# Patient Record
Sex: Male | Born: 1965
Health system: Southern US, Community
[De-identification: ages and names within clinical notes are randomized; demographics above are authoritative.]

## PROBLEM LIST (undated history)

## (undated) DIAGNOSIS — N529 Male erectile dysfunction, unspecified: Secondary | ICD-10-CM

## (undated) DIAGNOSIS — T7840XA Allergy, unspecified, initial encounter: Secondary | ICD-10-CM

## (undated) DIAGNOSIS — B20 Human immunodeficiency virus [HIV] disease: Secondary | ICD-10-CM

## (undated) DIAGNOSIS — Z21 Asymptomatic human immunodeficiency virus [HIV] infection status: Secondary | ICD-10-CM

## (undated) DIAGNOSIS — L853 Xerosis cutis: Secondary | ICD-10-CM

## (undated) DIAGNOSIS — G5600 Carpal tunnel syndrome, unspecified upper limb: Secondary | ICD-10-CM

## (undated) DIAGNOSIS — G4730 Sleep apnea, unspecified: Secondary | ICD-10-CM

## (undated) DIAGNOSIS — E291 Testicular hypofunction: Secondary | ICD-10-CM

## (undated) DIAGNOSIS — F32A Depression, unspecified: Secondary | ICD-10-CM

## (undated) HISTORY — DX: Allergy, unspecified, initial encounter: T78.40XA

## (undated) HISTORY — DX: Male erectile dysfunction, unspecified: N52.9

## (undated) HISTORY — DX: Xerosis cutis: L85.3

## (undated) HISTORY — DX: Carpal tunnel syndrome, unspecified upper limb: G56.00

## (undated) HISTORY — PX: WISDOM TOOTH EXTRACTION: SHX21

## (undated) HISTORY — DX: Testicular hypofunction: E29.1

## (undated) HISTORY — PX: HERNIA REPAIR: SHX51

---

## 2008-07-21 HISTORY — PX: FRACTURE SURGERY: SHX138

## 2009-12-06 ENCOUNTER — Ambulatory Visit: Payer: Self-pay | Admitting: Family Medicine

## 2009-12-06 DIAGNOSIS — J309 Allergic rhinitis, unspecified: Secondary | ICD-10-CM | POA: Insufficient documentation

## 2009-12-06 LAB — CONVERTED CEMR LAB
Bilirubin Urine: NEGATIVE
Nitrite: NEGATIVE
Protein, U semiquant: NEGATIVE
Urobilinogen, UA: 0.2

## 2009-12-10 ENCOUNTER — Telehealth: Payer: Self-pay | Admitting: Family Medicine

## 2009-12-10 DIAGNOSIS — L851 Acquired keratosis [keratoderma] palmaris et plantaris: Secondary | ICD-10-CM

## 2009-12-10 LAB — CONVERTED CEMR LAB
ALT: 16 units/L (ref 0–53)
AST: 16 units/L (ref 0–37)
Alkaline Phosphatase: 73 units/L (ref 39–117)
BUN: 11 mg/dL (ref 6–23)
Basophils Relative: 0.3 % (ref 0.0–3.0)
Bilirubin, Direct: 0.1 mg/dL (ref 0.0–0.3)
Chloride: 105 meq/L (ref 96–112)
Cholesterol: 165 mg/dL (ref 0–200)
Creatinine, Ser: 0.8 mg/dL (ref 0.4–1.5)
Eosinophils Relative: 2.7 % (ref 0.0–5.0)
GFR calc non Af Amer: 131 mL/min (ref >60)
LDL Cholesterol: 99 mg/dL (ref 0–99)
Lymphocytes Relative: 26.8 % (ref 12.0–46.0)
Monocytes Relative: 8.3 % (ref 3.0–12.0)
Neutrophils Relative %: 61.9 % (ref 43.0–77.0)
PSA: 1.31 ng/mL (ref 0.10–4.00)
Platelets: 241 10*3/uL (ref 150.0–400.0)
Potassium: 4.2 meq/L (ref 3.5–5.1)
RBC: 4.94 M/uL (ref 4.22–5.81)
Total Bilirubin: 1.1 mg/dL (ref 0.3–1.2)
Total CHOL/HDL Ratio: 3
Total Protein: 6.8 g/dL (ref 6.0–8.3)
Triglycerides: 85 mg/dL (ref 0.0–149.0)
VLDL: 17 mg/dL (ref 0.0–40.0)
WBC: 6 10*3/uL (ref 4.5–10.5)

## 2010-01-11 ENCOUNTER — Ambulatory Visit: Payer: Self-pay | Admitting: Family Medicine

## 2010-01-11 DIAGNOSIS — G5600 Carpal tunnel syndrome, unspecified upper limb: Secondary | ICD-10-CM

## 2010-02-19 ENCOUNTER — Telehealth: Payer: Self-pay | Admitting: Family Medicine

## 2010-08-20 NOTE — Assessment & Plan Note (Signed)
Summary: ONE MTH ROV // RS   Vital Signs:  Patient profile:   45 year old male Weight:      225 pounds Temp:     98.2 degrees F oral BP sitting:   110 / 80  (left arm) Cuff size:   regular  Vitals Entered By: Kern Reap CMA Duncan Dull) (January 11, 2010 10:11 AM) CC: follow-up visit   CC:  follow-up visit.  History of Present Illness: Mario Simmons is a 45 year old male, who comes in today for evaluation of hypertension, and numbness in his right arm x 6 months.  Hypertension runs in his family.  We been monitoring his blood pressure at home is normal.  Here today is 110/80.  Metabolic parameters, including blood sugar and lipids.  Normal  Is complaining of numbness in his right hand and arm for about 6 months.  He works in [employer].  He is right-handed.  Family History: Reviewed history from 12/06/2009 and no changes required. Father: stroke Mother: healthy Siblings: 1 brother - kidney disease Children: 1 daughter - healthy  Social History: Reviewed history from 12/06/2009 and no changes required. Occupation: food service Married Never Smoked Alcohol use-no Drug use-no  Review of Systems      See HPI  Physical Exam  General:  Well-developed,well-nourished,in no acute distress; alert,appropriate and cooperative throughout examination Neurologic:  No cranial nerve deficits noted. Station and gait are normal. Plantar reflexes are down-going bilaterally. DTRs are symmetrical throughout. Sensory, motor and coordinative functions appear intact.   Impression & Recommendations:  Problem # 1:  CARPAL TUNNEL SYNDROME, RIGHT (ICD-354.0) Assessment New  Patient Instructions: 1)  purchase and weigh her a short arm splint at bed time.  If in a couple weeks.  She don't see any improvement or the numbness gets worse.  Call Dr. Josephine Igo the hand surgeon for further evaluation. 2)  Check your blood pressure weekly. 3)  Return May 2012 for an annual exam 4)  BMP prior  to visit, ICD-9:................v70.0 5)  Hepatic Panel prior to visit, ICD-9: 6)  Lipid Panel prior to visit, ICD-9: 7)  TSH prior to visit, ICD-9: 8)  CBC w/ Diff prior to visit, ICD-9: 9)  Urine-dip prior to visit, ICD-9: 10)  PSA prior to visit, ICD-9:

## 2010-08-20 NOTE — Progress Notes (Signed)
Summary: referral  Phone Note Call from Patient   Summary of Call: patient is calling because he would like to see a dermatologist for his scalp.  is it okay for a referral?  Follow-up for Phone Call        Dr. Para Skeans..........Marland Kitchenhe can make his own appointment.  He does not need a referral Follow-up by: Roderick Pee MD,  Dec 10, 2009 3:33 PM  New Problems: DRY SKIN (ICD-701.1)   New Problems: DRY SKIN (ICD-701.1)

## 2010-08-20 NOTE — Progress Notes (Signed)
Summary: please return call  Phone Note Call from Patient Call back at Home Phone [phone]   Caller: Patient--live call Summary of Call: wants Fleet Contras to call in regarding the status of elbow surgery. Initial call taken by: Warnell Forester,  February 19, 2010 9:50 AM  Follow-up for Phone Call        patient is going to have surgery and dr Terri Piedra will send information to our office. Follow-up by: Kern Reap CMA Duncan Dull),  February 21, 2010 9:48 AM

## 2010-08-20 NOTE — Assessment & Plan Note (Signed)
Summary: NEW TO EST---CPX -WILL FAST//CCM   Vital Signs:  Patient profile:   45 year old male Height:      69 inches Weight:      220 pounds BMI:     32.61 Temp:     98.4 degrees F oral BP sitting:   120 / 80  (left arm) Cuff size:   regular  Vitals Entered By: Kern Reap CMA Duncan Dull) (Dec 06, 2009 9:32 AM) CC: new to establish care Is Patient Diabetic? No Pain Assessment Patient in pain? no        CC:  new to establish care.  History of Present Illness: Mario Simmons is a 45 year old male, nonsmoker, who comes in today as a new patient for physical examination  He is always been in excellent health.  He said no chronic health problems.  He does not smoke nor drink nor take any medications.  Review of systems he gets routine dental care as seasonal allergic rhinitis.  Has a very small umbilical hernia.  Otherwise, review of systems negative.  Family history, pertinent father had a stroke from hypertension.  One brother has chronic renal disease, etiology unknown.  Tetanus booster 93.  Originally from [place], [place],,,,,,,,, works in the [employer]  Alcohol-Tobacco     Smoking Status: never  Hep-HIV-STD-Contraception     Dental Visit-last 6 months yes      Drug Use:  no.    Past History:  Past medical, surgical, family and social histories (including risk factors) reviewed, and no changes noted (except as noted below).  Past Medical History: Allergic rhinitis  Family History: Reviewed history and no changes required. Father: stroke Mother: healthy Siblings: 1 brother - kidney disease Children: 1 daughter - healthy  Social History: Reviewed history and no changes required. Occupation: food service Married Never Smoked Alcohol use-no Drug use-no Smoking Status:  never Drug Use:  no Dental Care w/in 6 mos.:  yes  Review of Systems      See HPI  Physical Exam  General:   Well-developed,well-nourished,in no acute distress; alert,appropriate and cooperative throughout examination Head:  Normocephalic and atraumatic without obvious abnormalities. No apparent alopecia or balding. Eyes:  No corneal or conjunctival inflammation noted. EOMI. Perrla. Funduscopic exam benign, without hemorrhages, exudates or papilledema. Vision grossly normal. Ears:  External ear exam shows no significant lesions or deformities.  Otoscopic examination reveals clear canals, tympanic membranes are intact bilaterally without bulging, retraction, inflammation or discharge. Hearing is grossly normal bilaterally. Nose:  External nasal examination shows no deformity or inflammation. Nasal mucosa are pink and moist without lesions or exudates. Mouth:  Oral mucosa and oropharynx without lesions or exudates.  Teeth in good repair. Neck:  No deformities, masses, or tenderness noted. Chest Wall:  No deformities, masses, tenderness or gynecomastia noted. Breasts:  No masses or gynecomastia noted Lungs:  Normal respiratory effort, chest expands symmetrically. Lungs are clear to auscultation, no crackles or wheezes. Heart:  Normal rate and regular rhythm. S1 and S2 normal without gallop, murmur, click, rub or other extra sounds. Abdomen:  Bowel sounds positive,abdomen soft and non-tender without masses, organomegaly or hernias noted........dime-sized umbilical hernia Rectal:  No external abnormalities noted. Normal sphincter tone. No rectal masses or tenderness. Genitalia:  Testes bilaterally descended without nodularity, tenderness or masses. No scrotal masses or lesions. No penis lesions or urethral discharge. Prostate:  Prostate gland firm and smooth, no enlargement, nodularity, tenderness, mass, asymmetry or induration. Msk:  No deformity or scoliosis  noted of thoracic or lumbar spine.   Pulses:  R and L carotid,radial,femoral,dorsalis pedis and posterior tibial pulses are full and equal  bilaterally Extremities:  No clubbing, cyanosis, edema, or deformity noted with normal full range of motion of all joints.   Neurologic:  No cranial nerve deficits noted. Station and gait are normal. Plantar reflexes are down-going bilaterally. DTRs are symmetrical throughout. Sensory, motor and coordinative functions appear intact. Skin:  Intact without suspicious lesions or rashes Cervical Nodes:  No lymphadenopathy noted Axillary Nodes:  No palpable lymphadenopathy Inguinal Nodes:  No significant adenopathy Psych:  Cognition and judgment appear intact. Alert and cooperative with normal attention span and concentration. No apparent delusions, illusions, hallucinations   Impression & Recommendations:  Problem # 1:  Preventive Health Care (ICD-V70.0) Assessment New  Other Orders: Venipuncture (16109) TLB-Lipid Panel (80061-LIPID) TLB-BMP (Basic Metabolic Panel-BMET) (80048-METABOL) TLB-CBC Platelet - w/Differential (85025-CBCD) TLB-Hepatic/Liver Function Pnl (80076-HEPATIC) TLB-TSH (Thyroid Stimulating Hormone) (84443-TSH) TLB-PSA (Prostate Specific Antigen) (84153-PSA) UA Dipstick w/o Micro (automated)  (81003) Tdap => 77yrs IM (60454) Admin 1st Vaccine (09811)  Patient Instructions: 1)  check your blood pressure weekly since your father had high blood pressure............ the goal is 135/85 or less.  2)  Please schedule a follow-up appointment in 1 year.  Laboratory Results   Urine Tests  Date/Time Recieved: Dec 06, 2009 11:01 AM  Date/Time Reported: Dec 06, 2009 11:01 AM   Routine Urinalysis   Color: yellow Appearance: Clear Glucose: negative   (Normal Range: Negative) Bilirubin: negative   (Normal Range: Negative) Ketone: negative   (Normal Range: Negative) Spec. Gravity: 1.020   (Normal Range: 1.003-1.035) Blood: negative   (Normal Range: Negative) pH: 7.0   (Normal Range: 5.0-8.0) Protein: negative   (Normal Range: Negative) Urobilinogen: 0.2   (Normal Range:  0-1) Nitrite: negative   (Normal Range: Negative) Leukocyte Esterace: negative   (Normal Range: Negative)    Comments: Wynona Canes, CMA  Dec 06, 2009 11:01 AM      Immunizations Administered:  Tetanus Vaccine:    Vaccine Type: Tdap    Site: right deltoid    Mfr: [employer]    Dose: 0.5 ml    Route: IM    Given by: Kern Reap CMA (AAMA)    Exp. Date: 10/13/2011    Lot #: [id number]    Physician counseled: yes

## 2011-05-12 ENCOUNTER — Encounter: Payer: Self-pay | Admitting: Family Medicine

## 2011-05-13 ENCOUNTER — Encounter: Payer: Self-pay | Admitting: Family Medicine

## 2011-05-13 ENCOUNTER — Ambulatory Visit (INDEPENDENT_AMBULATORY_CARE_PROVIDER_SITE_OTHER): Payer: BC Managed Care – PPO | Admitting: Family Medicine

## 2011-05-13 DIAGNOSIS — M25572 Pain in left ankle and joints of left foot: Secondary | ICD-10-CM | POA: Insufficient documentation

## 2011-05-13 DIAGNOSIS — R5383 Other fatigue: Secondary | ICD-10-CM

## 2011-05-13 DIAGNOSIS — M25571 Pain in right ankle and joints of right foot: Secondary | ICD-10-CM

## 2011-05-13 DIAGNOSIS — R6882 Decreased libido: Secondary | ICD-10-CM

## 2011-05-13 DIAGNOSIS — M25579 Pain in unspecified ankle and joints of unspecified foot: Secondary | ICD-10-CM

## 2011-05-13 DIAGNOSIS — R5381 Other malaise: Secondary | ICD-10-CM

## 2011-05-13 LAB — BASIC METABOLIC PANEL
BUN: 11 mg/dL (ref 6–23)
CO2: 31 mEq/L (ref 19–32)
Chloride: 103 mEq/L (ref 96–112)
Potassium: 4.8 mEq/L (ref 3.5–5.1)

## 2011-05-13 LAB — CBC WITH DIFFERENTIAL/PLATELET
Basophils Absolute: 0 10*3/uL (ref 0.0–0.1)
Hemoglobin: 14.1 g/dL (ref 13.0–17.0)
Lymphocytes Relative: 25 % (ref 12.0–46.0)
Monocytes Relative: 8.4 % (ref 3.0–12.0)
Neutro Abs: 4.2 10*3/uL (ref 1.4–7.7)
Platelets: 226 10*3/uL (ref 150.0–400.0)
RDW: 13.6 % (ref 11.5–14.6)
WBC: 6.9 10*3/uL (ref 4.5–10.5)

## 2011-05-13 LAB — POCT URINALYSIS DIPSTICK
Bilirubin, UA: NEGATIVE
Leukocytes, UA: NEGATIVE
Nitrite, UA: NEGATIVE
pH, UA: 6

## 2011-05-13 LAB — LIPID PANEL
HDL: 44.6 mg/dL (ref >39.00)
LDL Cholesterol: 109 mg/dL — ABNORMAL HIGH (ref 0–99)
Total CHOL/HDL Ratio: 4
VLDL: 8.2 mg/dL (ref 0.0–40.0)

## 2011-05-13 LAB — PSA: PSA: 0.97 ng/mL (ref 0.10–4.00)

## 2011-05-13 LAB — HEPATIC FUNCTION PANEL
ALT: 18 U/L (ref 0–53)
Total Bilirubin: 1 mg/dL (ref 0.3–1.2)

## 2011-05-13 LAB — TSH: TSH: 0.96 u[IU]/mL (ref 0.35–5.50)

## 2011-05-13 NOTE — Patient Instructions (Signed)
Begin Motrin 600 mg twice daily with food for your ankle pain.  I will call you when I get the report on your lab work

## 2011-05-13 NOTE — Progress Notes (Signed)
  Subjective:    Patient ID: Mario Simmons, male    DOB: 1966/06/27, 45 y.o.   MRN: [id number]  HPI Mario Simmons is a 45 year old, married male, nonsmoker, who comes in today for evaluation of two problems.  For the past 5 months.  He felt fatigued.  He says he feels tired during the day and now he switched jobs.  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  He works about 10 hours a day 5 days a week.  The last 4 months.  He said, left and right ankle pain.  No previous history of trauma except he had some bad sprains when he is playing basketball as a teenager.  He points to both ankle joints as a source of his pain.  He's had no swelling.  He states it seems to be worse in the morning once he gets up and moves around.  It seems to get better.  He's been taking no medication.  He's also had decreased sexual drive and decreased sexual functioning and wonders if he might have a low testosterone level.  No history of thyroid disease.  Mother has a history of arthritis.   Review of Systems General orthopedic psychiatric, GU, review of systems otherwise negative    Objective:   Physical Exam  Well-developed well-nourished, male in no acute distress.  Examination of the neck shows the thyroid to be normal.  Extremities are normal.  There is some minor pain with movement of the ankle.  There is no redness or swelling or deformity.  Skin normal.  Pulses normal.      Assessment & Plan:  Osteoarthritis plan Motrin, 600 b.i.d. With food.  Fatigued with decreased libido.  Check labs

## 2011-05-20 ENCOUNTER — Ambulatory Visit (INDEPENDENT_AMBULATORY_CARE_PROVIDER_SITE_OTHER): Payer: BC Managed Care – PPO | Admitting: Family Medicine

## 2011-05-20 ENCOUNTER — Encounter: Payer: Self-pay | Admitting: *Deleted

## 2011-05-20 ENCOUNTER — Other Ambulatory Visit (INDEPENDENT_AMBULATORY_CARE_PROVIDER_SITE_OTHER): Payer: BC Managed Care – PPO

## 2011-05-20 DIAGNOSIS — E291 Testicular hypofunction: Secondary | ICD-10-CM

## 2011-05-20 LAB — TESTOSTERONE: Testosterone: 242.32 ng/dL — ABNORMAL LOW (ref 350.00–890.00)

## 2011-06-05 NOTE — Progress Notes (Signed)
  Subjective:    Patient ID: Mario Simmons, male    DOB: 04/11/1966, 45 y.o.   MRN: [id number]  HPI    Review of Systems     Objective:   Physical Exam        Assessment & Plan:  Patient rescheduled his appointment - no charge

## 2011-06-26 ENCOUNTER — Ambulatory Visit: Payer: BC Managed Care – PPO | Admitting: Family Medicine

## 2014-12-22 ENCOUNTER — Emergency Department (HOSPITAL_COMMUNITY)
Admission: EM | Admit: 2014-12-22 | Discharge: 2014-12-22 | Disposition: A | Discharge status: Home / Self Care | Payer: Self-pay | Attending: Emergency Medicine | Admitting: Emergency Medicine

## 2014-12-22 ENCOUNTER — Encounter (HOSPITAL_COMMUNITY): Payer: Self-pay | Admitting: Emergency Medicine

## 2014-12-22 DIAGNOSIS — H9191 Unspecified hearing loss, right ear: Secondary | ICD-10-CM | POA: Insufficient documentation

## 2014-12-22 DIAGNOSIS — R42 Dizziness and giddiness: Secondary | ICD-10-CM | POA: Insufficient documentation

## 2014-12-22 MED ORDER — MECLIZINE HCL 25 MG PO TABS: 25.0000 mg | ORAL_TABLET | Freq: Four times a day (QID) | ORAL | Status: DC

## 2014-12-22 NOTE — ED Notes (Signed)
Pt states "ive been diagnosed with something having to do with my ear". Pt states both his ears are bothering him, his wife tried to clean his ears and now he cant hear out of his right ear. Pt states insurance doesn't kick in yet so hes unable to see a specialist. Pt is AAOX4 in NAD. Denies pain.

## 2014-12-22 NOTE — Discharge Instructions (Signed)
°Emergency Department Resource Guide °1) Find a Doctor and Pay Out of Pocket °Although you won't have to find out who is covered by your insurance plan, it is a good idea to ask around and get recommendations. You will then need to call the office and see if the doctor you have chosen will accept you as a new patient and what types of options they offer for patients who are self-pay. Some doctors offer discounts or will set up payment plans for their patients who do not have insurance, but you will need to ask so you aren't surprised when you get to your appointment. ° °2) Contact Your Local Health Department °Not all health departments have doctors that can see patients for sick visits, but many do, so it is worth a call to see if yours does. If you don't know where your local health department is, you can check in your phone book. The CDC also has a tool to help you locate your state's health department, and many state websites also have listings of all of their local health departments. ° °3) Find a Walk-in Clinic °If your illness is not likely to be very severe or complicated, you may want to try a walk in clinic. These are popping up all over the country in pharmacies, drugstores, and shopping centers. They're usually staffed by nurse practitioners or physician assistants that have been trained to treat common illnesses and complaints. They're usually fairly quick and inexpensive. However, if you have serious medical issues or chronic medical problems, these are probably not your best option. ° °No Primary Care Doctor: °- Call [facility] at  832-8000 - they can help you locate a primary care doctor that  accepts your insurance, provides certain services, etc. °- Physician Referral Service- 1-800-533-3463 ° °Chronic Pain Problems: °Organization         Address  Phone   Notes  °[facility]  (336) 297-2271 Patients need to be referred by their primary care doctor.  ° °Medication  Assistance: °Organization         Address  Phone   Notes  °[place] [facility] [address] °[place], [place] [address] (336) 641-8030 --Must be a resident of [place] °-- Must have NO insurance coverage whatsoever (no Medicaid/ Medicare, etc.) °-- The pt. MUST have a primary care doctor that directs their care regularly and follows them in the community °  °[facility]  (866) 331-1348   °[employer]  (888) 892-1162   ° °Agencies that provide inexpensive medical care: °Organization         Address  Phone   Notes  °[facility]  (336) 832-8035   °[facility]    (336) 832-7272   °[facility] [address] °[place], [place] [address] (336) 832-4777   °[facility] [address], °[place] (336) 271-4999   °[facility]    (336) 373-0678   °[facility]    (336) 272-1050   °[facility] ° [address], [place] Phone:  (336) 832-4444, Fax:  (336) 832-4440 Hours of Operation:  9 am - 6 pm, M-F.  Also accepts Medicaid/Medicare and self-pay.  °[facility] ° [address],  Phone: (336) 832-3150, Fax: (336) 832-3151. Hours of Operation:  8:30 am - 5:30 pm, M-F.  Also accepts Medicaid and self-pay.  °[facility] [place] [address], [place] Phone: (336) 878-6027   °[facility] [address], [place], [place] (336)723-1848, Ext. 123 Mondays & Thursdays: 7-9 AM.  First 15 patients are seen on a first come, first serve basis. °  ° °Medicaid-accepting [place] Providers: ° °Organization         Address  Phone   Notes  °[facility] [address], [place] (336) 641-2100 Also accepts self-pay patients.  °[facility] [address], [place] ° (336) 856-9996   °[facility] [address], [place]  (336) 288-8857   °[facility] [address] [place] [address], [place] (336) 299-7000   °Veita Bland [address], [place]  ° (336) 373-1557 Only accepts [place] Access Medicaid patients after they have their name applied to their card.  ° °Self-Pay (no insurance) in [place]: ° °Organization         Address  Phone   Notes  °Sickle Cell Patients, [facility] [address], [place] (336) 832-1970   °[facility] [address], [place] (336) 832-4400   ° [facility] ° [address], [place] (336) 992-4800   °[facility]/Dr. Osei-Bonsu ° [address] [place] [address], [place] or [address], [place] (336) 841-8500 Phone number for both [place] and [place] locations is the same.  °[facility] [address], [place] (336) 299-0000   °[facility] [address] [place] [address], [place] or [address] (336) 852-7530 °(336) 878-2260   °[facility] [address], [place] (336) 350-1642, phone; (336) 294-5005, fax Sees patients 1st and 3rd Saturday of every month.  Must not qualify for public or private insurance (i.e. Medicaid, Medicare, [place] Health Choice, Veterans' Benefits) • Household income should be no more than 200% of the poverty level •The clinic cannot treat you if you are pregnant or think you are pregnant • Sexually transmitted diseases are not treated at the clinic.  ° ° °Dental Care: °Organization         Address  Phone  Notes  °[place] [facility] [address], [place] (336) 641-6152 Accepts children up to age 21 who are enrolled in Medicaid or [place] Health Choice; pregnant women with a Medicaid card; and children who have applied for Medicaid or [place] Health Choice, but were declined, whose parents can pay a reduced fee at time of service.  °[place]  [facility] [place]  [address], [place] (336) 641-7733 Accepts children up to age 21 who are enrolled in Medicaid or [place] Health Choice; pregnant women with a Medicaid card; and children who have applied for Medicaid or [place] Health Choice, but were declined, whose parents can pay a reduced fee at time of service.  °[facility] ° [address],  (336) 641-4533 Patients are seen by appointment only. Walk-ins are not accepted. [facility] will see patients 18 years of age and older. °Monday - Tuesday (8am-5pm) °Most Wednesdays (8:30-5pm) °$30 per visit, cash only  °[facility] ° [address], [place] (336) 641-4533 Patients are seen by appointment only. Walk-ins are not accepted. [facility] will see patients 18 years of age and older. °One   Wednesday Evening (Monthly: Volunteer Based).  $30 per visit, cash only  °[facility]  (919) 537-3737 for adults; Children under age 4, call Graduate Pediatric Dentistry at (919) [phone]. Children aged 4-14, please call (919) [phone] to request a pediatric application. ° Dental services are provided in all areas of dental care including fillings, crowns and bridges, complete and partial dentures, implants, gum treatment, root canals, and extractions. Preventive care is also provided. Treatment is provided to both adults and children. °Patients are selected via a lottery and there is often a waiting list. °  °[facility] [address], °[place] ° (336) 763-8833 [email or web address] °  °[facility] [address], [place], [place] (336)723-1848, Ext. 123 Second and Fourth Thursday of each month, opens at 6:30 AM; Clinic ends at 9 AM.  Patients are seen on a first-come first-served basis, and a limited number are seen during each clinic.  ° °[facility] ° [address], [place], [place] (336) 723-7904    Eligibility Requirements °You must have lived in [place], [place], or [place] counties for at least the last three months. °  You cannot be eligible for state or federal sponsored healthcare insurance, including Veterans Administration, Medicaid, or Medicare. °  You generally cannot be eligible for healthcare insurance through your employer.  °  How to apply: °Eligibility screenings are held every Tuesday and Wednesday afternoon from 1:00 pm until 4:00 pm. You do not need an appointment for the interview!  °[facility] [address], [place], [place] 336-631-2330   °[facility]  336-342-8273   °[place] [facility]  336-703-3100   °[facility]  336-570-6415   ° °Behavioral Health Resources in the Community: °Intensive Outpatient Programs °Organization         Address  Phone  Notes  °[facility] [address], [place], [place] 336-878-6098   °[facility] [address], [place], [place] 336-832-9800   °[facility] [address], [place], [place] ° 336-882-2125   °[facility] [address],  °[place], [place] 1-800-853-5163 or 336-641-4981   °Substance Abuse Resources °Organization         Address  Phone  Notes  °[facility]  336-882-2125   °[facility]  336-784-9470   °[facility]  336-285-9073   °[facility]  336-845-3988   °Residential & Outpatient Substance Abuse Program  1-800-659-3381   °Psychological Services °Organization         Address  Phone  Notes  °[facility]  336- 832-9600   °[facility]  336- 378-7881   °[facility] [address], [place] 1-800-853-5163 or 336-641-4981   ° °Mobile Crisis Teams °Organization         Address  Phone  Notes  °[facility], Mobile Crisis Care Unit  1-877-626-1772   °Assertive °[facility] ° [address]  [place], [place] 336-834-9664   °Sharon DeEsch [address] °[place] 336-554-5454   ° °Self-Help/Support Groups °Organization         Address  Phone             Notes  °[facility] - variety of support groups  336- 373-1402 Call for more information  °Narcotics Anonymous (NA), [facility] [address], °[place]  2 meetings at this location  ° °  Residential Treatment Programs °Organization         Address  Phone  Notes  °[facility] [address],    °[place]   1-866-801-8205   °[facility] ° [address], [place], [place] 704-293-8524   °[facility] [address], [place] 336-845-3988 Admissions: 8am-3pm M-F  °[facility] [address]    °[place], [place] 336-841-1104   °[facility] [address], [place], [place] 336-379-7146   °[facility] [address]  °[place], [place] 336-285-9073   °[facility] - Intensive Outpatient [address], [place], [place] 336-852-3033   °[facility] [address]  °[place], [place] 1-877-615-2722 or 336-784-9470   °[facility] [address] [place], [place] 336-227-7417 Accepts Medicaid  °[facility] [address]  °[place] 1-800-659-3381 Substance Abuse/Addiction Treatment  ° °[place] Behavioral Health Resources °Organization         Address  Phone  Notes  °[facility]  (888) 581-9988   °Julie Brannon, PhD [address] [place], [place]   (336) 349-5553 or (336) 951-0000   °[facility]   [address] °[place], [place] (336) 349-4454   °[facility] [address], [place], [place] (336) 342-8316 Insurance/Medicaid/sponsorship through [facility]  °[facility] [address]                                    [place], [place] (336) 342-8316 Therapy/tele-psych/case    °[facility] [address]  ° [place], [place] (336) 349-2233    °Dr. Arfeen  (336) 349-4544   °[facility] [place]  [employer] [place] [facility] 1) [address], [place] °2) [address], [place] °3)  [address], [place] (336) 349-3220 °(336) 342-7768 ° °(336) 342-8140   °[place] [facility] (336) 342-1394 or (336) 342-3537 (After Hours)    ° ° °

## 2014-12-22 NOTE — ED Provider Notes (Signed)
CSN: [id number]     Arrival date & time 12/22/14  1223 History  This chart was scribed for non-physician practitioner, Catha GosselinHanna Patel-Mills, PA-C working with Toy CookeyMegan Docherty, MD by Doreatha MartinEva Mathews, ED scribe. This patient was seen in room TR05C/TR05C and the patient's care was started at 12:34 PM       Chief Complaint  Patient presents with  . Otalgia   The history is provided by the patient. No language interpreter was used.    HPI Comments: Mario Simmons is a 49 y.o. male with Hx of Meniere's disease who presents to the Emergency Department complaining of gradually worsening, moderate hearing loss in bilateral ears onset 4 days PTA. He reports associated multiple episodes (x3) of dizziness, described as room spinning, onset 2 days ago. Per pt, symptoms onset after his wife cleaned his ears with ear drops. He was diagnosed in [place] with Meniere's disease and is not currently followed by an ENT. Pt states he takes Meclizine at home for his Meniere's but has not taken it recently. He reports that symptoms were relieved temporarily when yawning and when he cleared his ears by blowing his nose out. He denies any recent illness, fall, injury, or cold symptoms. He also denies congestion or hearing any swishing noises. He denies any history of allergies.   Past Medical History  Diagnosis Date  . Allergy   . Carpal tunnel syndrome   . Dry skin    History reviewed. No pertinent past surgical history. Family History  Problem Relation Age of Onset  . Stroke Father   . Healthy Mother   . Kidney disease Brother     1 Brother  . Healthy Child     1 Child   History  Substance Use Topics  . Smoking status: Never Smoker   . Smokeless tobacco: Not on file  . Alcohol Use: No    Review of Systems  Constitutional: Negative for fever and chills.  HENT: Negative for congestion, ear discharge and ear pain.        Hearing loss  Neurological: Positive for dizziness. Negative for syncope, speech  difficulty, weakness, light-headedness, numbness and headaches.   Allergies  Prednisone  Home Medications   Prior to Admission medications   Medication Sig Start Date End Date Taking? Authorizing Provider  meclizine (ANTIVERT) 25 MG tablet Take 1 tablet (25 mg total) by mouth 4 (four) times daily. 12/22/14   Vernelle Wisner Patel-Mills, PA-C   BP 114/66 mmHg  Pulse 80  Temp(Src) 98 F (36.7 C)  Resp 16  SpO2 99% Physical Exam  Constitutional: He is oriented to person, place, and time. He appears well-developed and well-nourished. No distress.  HENT:  Head: Normocephalic and atraumatic.  Right Ear: Tympanic membrane, external ear and ear canal normal. No lacerations. No foreign bodies. Tympanic membrane is not perforated. No middle ear effusion. Decreased hearing is noted.  Left Ear: Tympanic membrane, external ear and ear canal normal. No lacerations. No foreign bodies. Tympanic membrane is not perforated.  No middle ear effusion.  Mouth/Throat: Oropharynx is clear and moist.  Eyes: Conjunctivae and EOM are normal.  Neck: Normal range of motion.  Cardiovascular: Normal rate, regular rhythm and normal heart sounds.  Exam reveals no gallop and no friction rub.   No murmur heard. Pulmonary/Chest: Effort normal and breath sounds normal. No respiratory distress.  Abdominal: Soft. There is no tenderness.  Musculoskeletal: Normal range of motion.  Neurological: He is alert and oriented to person, place, and time. He has normal  strength. No sensory deficit. GCS eye subscore is 4. GCS verbal subscore is 5. GCS motor subscore is 6.  Cranial nerves III-XII in tact.   Skin: Skin is warm and dry.  Psychiatric: He has a normal mood and affect. His behavior is normal.  Nursing note and vitals reviewed.   ED Course  Procedures (including critical care time) DIAGNOSTIC STUDIES: Oxygen Saturation is 99% on RA, normal by my interpretation.    COORDINATION OF CARE: 12:42 PM Discussed treatment plan with  pt at bedside and pt agreed to plan.   Labs Review Labs Reviewed - No data to display  Imaging Review No results found.   EKG Interpretation None      MDM   Final diagnoses:  Hearing loss, right  Patient with a history of Minieres disease presents for dizziness and intermittent loss of hearing in the right ear for the past couple of weeks.  He states that he was able to clear the right ear by holding his nose and blowing out but the loss of hearing returned shortly after.  He states he was taking antivert for the dizziness previously but has not taken it recently. He had an appointment with ENT up [facility] but moved here and has not had insurance.  His neurological exam is normal and his vitals are stable. I do not suspect a stroke.  I think this is related to his inner ear problems that he has been diagnosed with in the past.  He states his mother has the same problem with her ears. He states he will have insurance in a month.  I prescribed antivert and gave him a referral to EENT and a pcp.  I personally performed the services described in this documentation, which was scribed in my presence. The recorded information has been reviewed and is accurate.   Catha Gosselin, PA-C 12/23/14 1241  Toy Cookey, MD 12/25/14 [id number]

## 2015-06-27 ENCOUNTER — Ambulatory Visit: Payer: Self-pay | Admitting: Adult Health

## 2015-07-25 ENCOUNTER — Ambulatory Visit (INDEPENDENT_AMBULATORY_CARE_PROVIDER_SITE_OTHER): Payer: BLUE CROSS/BLUE SHIELD | Admitting: Physician Assistant

## 2015-07-25 ENCOUNTER — Encounter: Payer: Self-pay | Admitting: Physician Assistant

## 2015-07-25 VITALS — BP 115/80 | HR 89 | Temp 98.7°F | Resp 16 | Ht 70.25 in | Wt 235.0 lb

## 2015-07-25 DIAGNOSIS — R0683 Snoring: Secondary | ICD-10-CM

## 2015-07-25 DIAGNOSIS — Z8639 Personal history of other endocrine, nutritional and metabolic disease: Secondary | ICD-10-CM

## 2015-07-25 DIAGNOSIS — Z7189 Other specified counseling: Secondary | ICD-10-CM

## 2015-07-25 DIAGNOSIS — Z7689 Persons encountering health services in other specified circumstances: Secondary | ICD-10-CM

## 2015-07-25 LAB — TESTOSTERONE: TESTOSTERONE: 210 ng/dL — AB (ref 300–890)

## 2015-07-25 MED ORDER — CETIRIZINE HCL 10 MG PO TABS: 10.0000 mg | ORAL_TABLET | Freq: Every day | ORAL | Status: DC

## 2015-07-25 MED ORDER — ACETAMINOPHEN 500 MG PO TABS: 1000.0000 mg | ORAL_TABLET | Freq: Three times a day (TID) | ORAL | Status: DC | PRN

## 2015-07-25 MED ORDER — HYDROCODONE-HOMATROPINE 5-1.5 MG/5ML PO SYRP: 2.5000 mL | ORAL_SOLUTION | Freq: Every evening | ORAL | Status: DC | PRN

## 2015-07-25 NOTE — Progress Notes (Signed)
07/26/2015 10:19 AM   DOB: 08/24/1965 / MRN: [id number]  SUBJECTIVE:  Mario Simmons is a 50 y.o. male presenting to establish care.  There is a paucity of data available in [facility]. He is a never smoker, does not drink or use illicit drugs.  Father had a stroke per [facility]. He has a history of allergies and takes flonase for this.  He is also taking meclizine and Mucinex D per ENT recs for sinus difficulty.  He is complaining of sleep apnea.  This has never been evaluated and he would like a referral to sleep medicine for this. States that he recorded himself sleeping and was snoring loudly, having apnea, and gasping for breath.  Reports always feeling tired.   Complains of a history of low T.  [facility] confirms this.  He would like treatment.    Immunization History  Administered Date(s) Administered  . Td 12/06/2009    He is allergic to prednisone.   He  has a past medical history of Allergy; Carpal tunnel syndrome; and Dry skin.    He  reports that he has never smoked. He does not have any smokeless tobacco history on file. He reports that he does not drink alcohol or use illicit drugs. He  has no sexual activity history on file. The patient  has past surgical history that includes Fracture surgery (Left, 2010).  His family history includes Diabetes in his paternal grandmother; Healthy in his child and mother; Hyperlipidemia in his mother; Kidney disease in his brother; Stroke in his father.  Review of Systems  Constitutional: Negative for fever and chills.  HENT: Positive for congestion. Negative for sore throat.   Eyes: Negative for blurred vision.  Respiratory: Negative for cough and shortness of breath.   Cardiovascular: Negative for chest pain.  Gastrointestinal: Negative for nausea and abdominal pain.  Genitourinary: Negative for dysuria, urgency and frequency.  Musculoskeletal: Negative for myalgias.  Skin: Negative for rash.  Neurological: Negative for dizziness, tingling and  headaches.  Psychiatric/Behavioral: Negative for depression. The patient is not nervous/anxious.     Problem list and medications reviewed and updated by myself where necessary, and exist elsewhere in the encounter.   OBJECTIVE:  BP 115/80 mmHg  Pulse 89  Temp(Src) 98.7 F (37.1 C) (Oral)  Resp 16  Ht 5' 10.25" (1.784 m)  Wt 235 lb (106.595 kg)  BMI 33.49 kg/m2  SpO2 95%  Physical Exam  Constitutional: He is oriented to person, place, and time. He appears well-developed and well-nourished.  HENT:  Head: Normocephalic and atraumatic.  Right Ear: Hearing, tympanic membrane, external ear and ear canal normal.  Left Ear: Hearing, tympanic membrane, external ear and ear canal normal.  Nose: Nose normal.  Mouth/Throat: Uvula is midline, oropharynx is clear and moist and mucous membranes are normal.  Eyes: Conjunctivae, EOM and lids are normal. Pupils are equal, round, and reactive to light. Right eye exhibits no discharge. Left eye exhibits no discharge. No scleral icterus.  Neck: Trachea normal and normal range of motion. Neck supple. Carotid bruit is not present.  Cardiovascular: Normal rate, regular rhythm, normal heart sounds, intact distal pulses and normal pulses.   No murmur heard. Pulmonary/Chest: Effort normal and breath sounds normal. No respiratory distress. He has no wheezes. He has no rhonchi. He has no rales.  Abdominal: Soft. Normal appearance and bowel sounds are normal. He exhibits no abdominal bruit. There is no tenderness.  Musculoskeletal: Normal range of motion. He exhibits no edema or tenderness.  Lymphadenopathy:       Head (right side): No submental, no submandibular, no tonsillar, no preauricular, no posterior auricular and no occipital adenopathy present.       Head (left side): No submental, no submandibular, no tonsillar, no preauricular, no posterior auricular and no occipital adenopathy present.    He has no cervical adenopathy.  Neurological: He is alert  and oriented to person, place, and time. He has normal strength and normal reflexes. No cranial nerve deficit or sensory deficit. Coordination and gait normal.  Skin: Skin is warm, dry and intact. No lesion and no rash noted.  Psychiatric: He has a normal mood and affect. His speech is normal and behavior is normal. Judgment and thought content normal.    Results for orders placed or performed in visit on 07/25/15 (from the past 48 hour(s))  Testosterone     Status: Abnormal   Collection Time: 07/25/15  4:35 PM  Result Value Ref Range   Testosterone 210 (L) 300 - 890 ng/dL    Comment:           Tanner Stage       Male              Male               I              < 30 ng/dL        < 10 ng/dL               II             < 150 ng/dL       < 30 ng/dL               III            100-320 ng/dL     < 35 ng/dL               IV             200-970 ng/dL     16-10 ng/dL               V/Adult        300-890 ng/dL     96-04 ng/dL       ASSESSMENT AND PLAN  Akshay was seen today for establish care and sleep apnea.  Diagnoses and all orders for this visit:  Snoring -     Ambulatory referral to Neurology  Encounter to establish care: Will bring him back in 6 months for annual screenings after specialist evaluations.   History of hypotestosteronemia -     Testosterone -     Ambulatory referral to Urology    The patient was advised to call or return to clinic if he does not see an improvement in symptoms or to seek the care of the closest emergency department if he worsens with the above plan.   Deliah Boston, MHS, PA-C [facility] 07/26/2015 10:19 AM

## 2015-07-26 ENCOUNTER — Encounter: Payer: Self-pay | Admitting: Neurology

## 2015-07-26 ENCOUNTER — Ambulatory Visit (INDEPENDENT_AMBULATORY_CARE_PROVIDER_SITE_OTHER): Payer: BLUE CROSS/BLUE SHIELD | Admitting: Neurology

## 2015-07-26 VITALS — BP 122/84 | HR 68 | Resp 20 | Ht 70.0 in | Wt 238.0 lb

## 2015-07-26 DIAGNOSIS — G4710 Hypersomnia, unspecified: Secondary | ICD-10-CM | POA: Diagnosis not present

## 2015-07-26 DIAGNOSIS — J0121 Acute recurrent ethmoidal sinusitis: Secondary | ICD-10-CM

## 2015-07-26 DIAGNOSIS — H832X1 Labyrinthine dysfunction, right ear: Secondary | ICD-10-CM

## 2015-07-26 DIAGNOSIS — R0683 Snoring: Secondary | ICD-10-CM

## 2015-07-26 DIAGNOSIS — G4730 Sleep apnea, unspecified: Secondary | ICD-10-CM | POA: Diagnosis not present

## 2015-07-26 NOTE — Progress Notes (Signed)
SLEEP MEDICINE CLINIC   Provider:  Melvyn Novas, M D  Referring Provider: Silvestre Mesi Primary Care Physician:  Dolores Lory, PA-C  Chief Complaint  Patient presents with  . New Patient (Initial Visit)    snoring is bad, since 2014, pt's mother says he has apnea, tired during the day, rm 11, alone    HPI:  Mario Simmons is a 50 y.o. male , seen here as a referral from [place] Deliah Boston for a sleep consultation, Mr. Mcminn is a right-handed, married and full time gainfully employed gentleman who is seen here today to evaluate the suspicion that he has obstructive sleep apnea. He reported that over the last year or so he has become increasingly daytime fatigue and he feels always tired no matter how long he slept the night before. His wife has reported him to snore very loudly. He wakes up with a parched, dry mouth. He does not report morning headaches. At work he keeps physically and mentally active but as soon as he would not be stimulated in either way he feels that he is drowsy enough to fall asleep. He works early shifts. The patient reports that his wife has been rather angry with him about falling asleep at the drop of her head and snoring loudly. She often notches him even when he sits up next to her in conversation as he seems to drift off -but he is not aware that he is asleep. He struggles with sinus infections and respiratory allergies.  Sleep habits are as follows: He is going to bed between 8 and 9 PM as he works an early shift, he is usually asleep very promptly. The bedroom as cool, quiet and dark- he uses a light blocking shade and earplugs but he likes to sleep supine. He sleeps on one pillow .His wife frequently notches him and she turns to the side or even prone-  but he seems always to resume the supine sleep position within 20 minutes. His wife noticed him to snore loudly but also to have long pauses in his breathing and then gasping loudly for air. He will  wake up twice at night with the urge to urinate. But he drinks more water because he mouth is always dry and when he wakes up in the morning he feels parched. He gets up at 5.45, and usually has to be at work at 7 AM. He does not drink any kind of caffeine, he normally drinks juice or water. He works mostly indoors but does have access to day light. He is taking his break at 2:00 PM and uses a half hour to relax and often falls asleep in his car. He only has 30 minutes so this is more like a power nap. Takes up from this nap he feels more refreshed than he feels in the morning.  Sleep medical history and family sleep history: father is a loud snorer, he fell asleep in the cinema.   Social history: No coffein, no nicotine and no drugs. No ETOH since age 19.   Review of Systems: Out of a complete 14 system review, the patient complains of only the following symptoms, and all other reviewed systems are negative. The patient endorsed ringing in his ears, or sometimes a spinning sensation hearing loss of feeling of fullness in the ear stuffiness, weight gain, fatigue, blurred vision, wheezing, snoring, weakness, of not getting enough rest., And male impotence, erectile difficulty.  Epworth score 13 , Fatigue severity score  54  , depression score PHq 9 at 5 points    Social History   Social History  . Marital Status: Married    Spouse Name: N/A  . Number of Children: N/A  . Years of Education: N/A   Occupational History  . Food Service    Social History Main Topics  . Smoking status: Never Smoker   . Smokeless tobacco: Not on file  . Alcohol Use: No  . Drug Use: No  . Sexual Activity: Not on file   Other Topics Concern  . Not on file   Social History Narrative    Family History  Problem Relation Age of Onset  . Stroke Father   . Healthy Mother   . Hyperlipidemia Mother   . Kidney disease Brother     1 Brother  . Healthy Child     1 Child  . Diabetes Paternal Grandmother      Past Medical History  Diagnosis Date  . Allergy   . Carpal tunnel syndrome   . Dry skin     Past Surgical History  Procedure Laterality Date  . Fracture surgery Left 2010    middle and 4th finger    Current Outpatient Prescriptions  Medication Sig Dispense Refill  . fluticasone (FLONASE) 50 MCG/ACT nasal spray Place 2 sprays into both nostrils daily.    . meclizine (ANTIVERT) 25 MG tablet Take 1 tablet (25 mg total) by mouth 4 (four) times daily. 28 tablet 0  . Pseudoephedrine-Guaifenesin (MUCINEX D PO) Take by mouth every 12 (twelve) hours.     No current facility-administered medications for this visit.    Allergies as of 07/26/2015 - Review Complete 07/26/2015  Allergen Reaction Noted  . Prednisone  12/22/2014    Vitals: BP 122/84 mmHg  Pulse 68  Resp 20  Ht 5\' 10"  (1.778 m)  Wt 238 lb (107.956 kg)  BMI 34.15 kg/m2 Last Weight:  Wt Readings from Last 1 Encounters:  07/26/15 238 lb (107.956 kg)   UJW:JXBJ mass index is 34.15 kg/(m^2).     Last Height:   Ht Readings from Last 1 Encounters:  07/26/15 5\' 10"  (1.778 m)    Physical exam:  General: The patient is awake, alert and appears not in acute distress. The patient is well groomed. Head: Normocephalic, atraumatic. Neck is supple. Mallampati 3 , Eardrum difficult to visualize, lot's of wax.  neck circumference:17.25. Nasal airflow restricted , septal deviation .  TMJ is not  evident . Retrognathia is not seen.  Cardiovascular:  Regular rate and rhythm , without  murmurs or carotid bruit, and without distended neck veins. Respiratory: Lungs are clear to auscultation. Skin:  Without evidence of edema, or rash Trunk: The patient's posture is erect  Neurologic exam : The patient is awake and alert, oriented to place and time.   Memory subjective described as intact.   Attention span & concentration ability appears normal.  Speech is fluent,  without  dysarthria, dysphonia or aphasia.  Mood and affect are  appropriate.  Cranial nerves: Pupils are equal and briskly reactive to light. Funduscopic exam without  evidence of pallor or edema. Extraocular movements  in vertical and horizontal planes intact and without nystagmus. Visual fields by finger perimetry are intact. Hearing to finger rub intact.   Facial sensation intact to fine touch.  Facial motor strength is symmetric and tongue and uvula move midline. Shoulder shrug was symmetrical.   Motor exam:   Normal tone, muscle bulk and symmetric strength in  all extremities.  Sensory:  Fine touch, pinprick and vibration were tested in all extremities. Proprioception tested in the upper extremities was normal.  Coordination: Rapid alternating movements in the fingers/hands was normal. Finger-to-nose maneuver  normal without evidence of ataxia, dysmetria or tremor.  Gait and station: Patient walks without assistive device and is able unassisted to climb up to the exam table. Strength within normal limits.  Stance is stable and normal.  Deep tendon reflexes: in the  upper and lower extremities are symmetric and intact. Babinski maneuver response is down going.  The patient was advised of the nature of the diagnosed sleep disorder , the treatment options and risks for general a health and wellness arising from not treating the condition.  I spent more than 40 minutes of face to face time with the patient. Greater than 50% of time was spent in counseling and coordination of care. We have discussed the diagnosis and differential and I answered the patient's questions.     Assessment:  After physical and neurologic examination, review of laboratory studies,  Personal review of imaging studies, reports of other /same  Imaging studies ,  Results of polysomnography/ neurophysiology testing and pre-existing records as far as provided in visit., my assessment is   1) Mr. Tyler DeisWheeler describes tinnitus, a feeling of partial hearing loss and vertigo. A spinning  sensation that lasts about 20 minutes of a counterclockwise rotation. There is worsening if he moves fast. He has noted a fullness sensation in his sinus system and  and ears.  Was placed on Mucinex. He feels rhinitis improved but not the hearing.   2) Mr. Tyler DeisWheeler was witnessed to snore actually his wife recorded him, and he also has apnea as captured on an audio tape. I think his daytime excessive sleepiness is very likely related to untreated obstructive sleep apnea and I will refer the patient therefore for split-night polysomnography. He feels very fatigued but he does not report a morning headache and he has no history of pulmonary disease. He is also cardiac-wise checked out very healthy. For this reason we do not need capnography.   3) sleep hygiene has actually been excellent in this patient and there is very little room for improvement he is going to bed and time he's falling asleep promptly he is trying to sleep through the night but his snoring and apnea while his wife. He has 2 bathroom breaks at night. He wakes up with a very dry mouth and has no problem with sinusitis, rhinitis and apparently some hearing loss and vertigo.  Plan:  Treatment plan and additional workup :  He is seeing Dr. Osborn Cohoavid Shoemaker for ear, nose and throat therapy. Dr. Kelle DartingJeffrey Todd  tested him for testosterone deficiency, this was not present. We will perform a split-night polysomnography with this patient and treat him if necessary with CPAP. His nasal passageway has much improved by using Mucinex and enough fluid. I used sometimes a decongestant and  daily antihistamine to treat the fullness sensation in his ears. He already has tried the anterior allergy medication route.  Allegra daily for 14 days, and if necessary Rhinocort or Nasacort. He has been prescribed Flonase in both nostril eery day, as prescribed by Dr. Tawanna Coolerodd.     Rv after SPLIT study  Melvyn Novasarmen Jessey Stehlin MD  07/26/2015   CC: Ofilia NeasMichael L Clark, Pa-c [address] [place], [place] [id number]

## 2015-07-28 NOTE — Progress Notes (Signed)
Thank you for your excellent care of this patient.  Deliah BostonMichael Clark, MS, PA-C 12:44 PM, 07/28/2015

## 2015-07-31 ENCOUNTER — Encounter: Payer: Self-pay | Admitting: Urology

## 2015-07-31 ENCOUNTER — Ambulatory Visit (INDEPENDENT_AMBULATORY_CARE_PROVIDER_SITE_OTHER): Payer: BLUE CROSS/BLUE SHIELD | Admitting: Urology

## 2015-07-31 VITALS — BP 121/77 | HR 81 | Temp 98.5°F | Ht 70.0 in | Wt 238.0 lb

## 2015-07-31 DIAGNOSIS — N528 Other male erectile dysfunction: Secondary | ICD-10-CM

## 2015-07-31 DIAGNOSIS — E291 Testicular hypofunction: Secondary | ICD-10-CM | POA: Diagnosis not present

## 2015-07-31 DIAGNOSIS — N529 Male erectile dysfunction, unspecified: Secondary | ICD-10-CM

## 2015-07-31 NOTE — Progress Notes (Signed)
07/31/2015 5:00 PM   Mario Simmons October 01, 1965 [id number]  Referring provider: Ofilia Neas, PA-C [address] [place], [place] [address]  Chief Complaint  Patient presents with  . Erectile Dysfunction    referred by Deliah Boston at [facility] in [place]  . Hypogonadism    HPI: Patient is a 50 year old African American male who was referred by his PCP, Deliah Boston PA-C, for hypogonadism and erectile dysfunction.  He presents with his wife,  Sink.  Hypogonadism Patient presents with the symptoms of reduced libido, erectile dysfunction, infertility, a reduced incidence of spontaneous erections, an increase of body fat, a decrease in physical and work performance, disturbances in sleep patterns, decreased energy and motivation, a decrease in cognitive function and mood changes.   This is indicated by his responses to the ADAM questionnaire.  He has had a history of low testosterone levels, but they have been afternoon draws.  His prolactin was 13.7 ng/mL on 05/21/2011.          Androgen Deficiency in the Aging Male      07/31/15 1500       Androgen Deficiency in the Aging Male   Do you have a decrease in libido (sex drive) Yes     Do you have lack of energy Yes     Do you have a decrease in strength and/or endurance Yes     Have you lost height Yes     Have you noticed a decreased "enjoyment of life" Yes     Are you sad and/or grumpy Yes     Are your erections less strong Yes     Have you noticed a recent deterioration in your ability to play sports Yes     Are you falling asleep after dinner Yes     Has there been a recent deterioration in your work performance No       Erectile dysfunction His SHIM score is 8, which is moderate erectile dysfunction.   He has been having difficulty with erections for about three years.   His major complaint is achieving and maintaining an erection.  He is also undergoing a work up for episodes of dizziness and nausea that happen  spontaneously.  He is having worsening of this dizziness and nausea while attempting sexual activity.    His libido is diminished.   His risk factors for ED are age, BPH and  hypogonadism.  He denies any painful erections or curvatures with his erections.          SHIM      07/31/15 1544       SHIM: Over the last 6 months:   How do you rate your confidence that you could get and keep an erection? Very Low     When you had erections with sexual stimulation, how often were your erections hard enough for penetration (entering your partner)? A Few Times (much less than half the time)     During sexual intercourse, how often were you able to maintain your erection after you had penetrated (entered) your partner? Extremely Difficult     During sexual intercourse, how difficult was it to maintain your erection to completion of intercourse? Extremely Difficult     When you attempted sexual intercourse, how often was it satisfactory for you? Difficult     SHIM Total Score   SHIM 8        Score: 1-7 Severe ED 8-11 Moderate ED 12-16 Mild-Moderate ED 17-21 Mild ED  22-25 No ED  His wife is very upset and frustrated with his lack of desire for intercourse and his inability to have intercourse.  She feels that he has been in denial and has not been very motivated in getting evaluated and seeking treatment for his condition.  She states it is effecting their marriage and hinted about a possible divorce if this don't improve.  She is willing to help in anyway to improve the situation.  I believe that if the patient demonstrates motivation in this area, the marriage can be salvaged.     PMH: Past Medical History  Diagnosis Date  . Allergy   . Carpal tunnel syndrome   . Dry skin   . Hypogonadism in male   . ED (erectile dysfunction)     Surgical History: Past Surgical History  Procedure Laterality Date  . Fracture surgery Left 2010    middle and 4th finger    Home Medications:      Medication List       This list is accurate as of: 07/31/15  5:00 PM.  Always use your most recent med list.               fluticasone 50 MCG/ACT nasal spray  Commonly known as:  FLONASE  Place 2 sprays into both nostrils daily.     meclizine 25 MG tablet  Commonly known as:  ANTIVERT  Take 1 tablet (25 mg total) by mouth 4 (four) times daily.     MUCINEX D PO  Take by mouth every 12 (twelve) hours.        Allergies:  Allergies  Allergen Reactions  . Prednisone     "makes me have nightmares"    Family History: Family History  Problem Relation Age of Onset  . Stroke Father   . Healthy Mother   . Hyperlipidemia Mother   . Kidney disease Brother     1 Brother  . Healthy Child     1 Child  . Diabetes Paternal Grandmother   . Kidney disease Brother   . Prostate cancer Neg Hx   . Hypertension Father     Social History:  reports that he has never smoked. He does not have any smokeless tobacco history on file. He reports that he does not drink alcohol or use illicit drugs.  ROS: UROLOGY Frequent Urination?: No Hard to postpone urination?: No Burning/pain with urination?: No Get up at night to urinate?: No Leakage of urine?: No Urine stream starts and stops?: No Trouble starting stream?: No Do you have to strain to urinate?: No Blood in urine?: No Urinary tract infection?: No Sexually transmitted disease?: No Injury to kidneys or bladder?: No Painful intercourse?: No Weak stream?: No Erection problems?: Yes Penile pain?: No  Gastrointestinal Nausea?: Yes Vomiting?: Yes Indigestion/heartburn?: No Diarrhea?: No Constipation?: No  Constitutional Fever: Yes Night sweats?: No Weight loss?: No Fatigue?: Yes  Skin Skin rash/lesions?: No Itching?: No  Eyes Blurred vision?: Yes Double vision?: Yes  Ears/Nose/Throat Sore throat?: Yes Sinus problems?: Yes  Hematologic/Lymphatic Swollen glands?: Yes Easy bruising?: No  Cardiovascular Leg  swelling?: No Chest pain?: No  Respiratory Cough?: Yes Shortness of breath?: Yes  Endocrine Excessive thirst?: Yes  Musculoskeletal Back pain?: No Joint pain?: Yes  Neurological Headaches?: Yes Dizziness?: Yes  Psychologic Depression?: No Anxiety?: No  Physical Exam: BP 121/77 mmHg  Pulse 81  Temp(Src) 98.5 F (36.9 C) (Oral)  Ht 5\' 10"  (1.778 m)  Wt 238 lb (107.956 kg)  BMI 34.15 kg/m2  Constitutional: Well nourished. Alert and oriented, No acute distress. HEENT: [place] AT, moist mucus membranes. Trachea midline, no masses. Cardiovascular: No clubbing, cyanosis, or edema. Respiratory: Normal respiratory effort, no increased work of breathing. GI: Abdomen is soft, non tender, non distended, no abdominal masses. Liver and spleen not palpable.  No hernias appreciated.  Stool sample for occult testing is not indicated.   GU: No CVA tenderness.  No bladder fullness or masses.  Patient with uncircumcised phallus. Foreskin easily retracted  Urethral meatus is patent.  No penile discharge. No penile lesions or rashes. Scrotum without lesions, cysts, rashes and/or edema.  Testicles are located scrotally bilaterally. No masses are appreciated in the testicles. Left and right epididymis are normal. Rectal: Patient with  normal sphincter tone. Anus and perineum without scarring or rashes. No rectal masses are appreciated. Prostate is approximately 50  grams, no nodules are appreciated. Seminal vesicles are normal. Skin: No rashes, bruises or suspicious lesions. Lymph: No cervical or inguinal adenopathy. Neurologic: Grossly intact, no focal deficits, moving all 4 extremities. Psychiatric: Normal mood and affect.  Laboratory Data: Lab Results  Component Value Date   WBC 6.9 05/13/2011   HGB 14.1 05/13/2011   HCT 42.9 05/13/2011   MCV 84.9 05/13/2011   PLT 226.0 05/13/2011    Lab Results  Component Value Date   CREATININE 0.9 05/13/2011    Lab Results  Component Value Date     PSA 0.97 05/13/2011   PSA 1.31 12/06/2009    Lab Results  Component Value Date   TESTOSTERONE 210* 07/25/2015    Lab Results  Component Value Date   TSH 0.96 05/13/2011     Assessment & Plan:    1. Hypogonadism:    I explained to patient that hypogonadism is diagnosed after 2 morning serum testosterone draws before 9 AM on 2 separate occasions returned below the laboratories parameter for normal testosterone.   I discussed with the patient the side effects of testosterone therapy, such as: enlargement of the prostate gland that may in turn cause LUTS, possible increased risk of PCa, DVT's and/or PE's, possible increased risk of heart attack or stroke, lower sperm count, swelling of the ankles, feet, or body, with or without heart failure, enlarged or painful breasts, have problems breathing while you sleep (sleep apnea), increased prostate specific antigen, mood swings, hypertension and increased red blood cell count.    He is currently awaiting approval from his insurance company for a sleep study test.  I explained to the patient that I would like him to have the sleep study and if appropriate, started on his CPAP machine before starting any exogenous testosterone therapy.    I also discussed that some men have had success using clomid for hypogonadism.  It does seem to be more successful in younger men, but there are incidences of good results in middle-aged men.  I explained that it is used in male infertility to stimulate the testicles to make more testosterone/sperm.  There has been no long term data on side effects, but some urologists has been having success with this medication.   He will return in the morning before 9 AM for his first serum testosterone draw.   He will also need a PSA, HCT, lipids, prolactin and hepatic panel.  He will then return on Friday for his second serum testosterone draw before 9 AM.  2. Erectile dysfunction:   His SHIM score today is 8.  He is  suffering from episodes  of extreme dizziness and nausea that are exacerbated by attempting sexual activities.  I do not want to give a trial of PDE5-inhibitors at this time due to the strong possibility it will make the dizziness and nausea worse.  We will readdress once the patient has had his testosterone brought to therapeutic levels.     Return for lipids, PSA, hepatic panel, prolactin, TSH, HCT and testosterone before 9 AM.  Greater than 50% was spent in counseling & coordination of care with the patient.  These notes generated with voice recognition software. I apologize for typographical errors.  Michiel Cowboy, PA-C  [facility] [address], [address] [place], [place] [address] [phone]

## 2015-08-01 ENCOUNTER — Other Ambulatory Visit: Payer: BLUE CROSS/BLUE SHIELD

## 2015-08-01 DIAGNOSIS — E291 Testicular hypofunction: Secondary | ICD-10-CM

## 2015-08-01 DIAGNOSIS — Z79899 Other long term (current) drug therapy: Secondary | ICD-10-CM

## 2015-08-02 ENCOUNTER — Telehealth: Payer: Self-pay

## 2015-08-02 ENCOUNTER — Other Ambulatory Visit: Payer: Self-pay | Admitting: Urology

## 2015-08-02 DIAGNOSIS — E221 Hyperprolactinemia: Secondary | ICD-10-CM

## 2015-08-02 DIAGNOSIS — E229 Hyperfunction of pituitary gland, unspecified: Principal | ICD-10-CM

## 2015-08-02 DIAGNOSIS — R7989 Other specified abnormal findings of blood chemistry: Secondary | ICD-10-CM

## 2015-08-02 LAB — PSA: Prostate Specific Ag, Serum: 1.2 ng/mL (ref 0.0–4.0)

## 2015-08-02 LAB — TESTOSTERONE: TESTOSTERONE: 262 ng/dL — AB (ref 348–1197)

## 2015-08-02 LAB — HEPATIC FUNCTION PANEL
ALBUMIN: 4.2 g/dL (ref 3.5–5.5)
ALK PHOS: 74 IU/L (ref 39–117)
ALT: 19 IU/L (ref 0–44)
AST: 20 IU/L (ref 0–40)
BILIRUBIN TOTAL: 0.6 mg/dL (ref 0.0–1.2)
BILIRUBIN, DIRECT: 0.16 mg/dL (ref 0.00–0.40)
TOTAL PROTEIN: 7.6 g/dL (ref 6.0–8.5)

## 2015-08-02 LAB — LIPID PANEL
CHOL/HDL RATIO: 4.5 ratio (ref 0.0–5.0)
Cholesterol, Total: 175 mg/dL (ref 100–199)
HDL: 39 mg/dL — ABNORMAL LOW (ref >39)
LDL CALC: 123 mg/dL — AB (ref 0–99)
Triglycerides: 64 mg/dL (ref 0–149)
VLDL CHOLESTEROL CAL: 13 mg/dL (ref 5–40)

## 2015-08-02 LAB — PROLACTIN: Prolactin: 24.9 ng/mL — ABNORMAL HIGH (ref 4.0–15.2)

## 2015-08-02 LAB — TSH: TSH: 1.36 u[IU]/mL (ref 0.450–4.500)

## 2015-08-02 LAB — HEMATOCRIT: Hematocrit: 42.9 % (ref 37.5–51.0)

## 2015-08-02 NOTE — Telephone Encounter (Signed)
Spoke with pt in reference prolactin levels and MRI. Pt voiced understanding. Orders placed.

## 2015-08-02 NOTE — Telephone Encounter (Signed)
-----   Message from Harle BattiestShannon A McGowan, PA-C sent at 08/02/2015 12:54 PM EST ----- Patient's prolactin level is high.  He will need a MRI of his pituitary gland.

## 2015-08-08 ENCOUNTER — Ambulatory Visit
Admission: RE | Admit: 2015-08-08 | Discharge: 2015-08-08 | Disposition: A | Discharge status: Home / Self Care | Payer: BLUE CROSS/BLUE SHIELD | Source: Ambulatory Visit | Attending: Urology | Admitting: Urology

## 2015-08-08 DIAGNOSIS — H7090 Unspecified mastoiditis, unspecified ear: Secondary | ICD-10-CM | POA: Diagnosis not present

## 2015-08-08 DIAGNOSIS — H538 Other visual disturbances: Secondary | ICD-10-CM | POA: Insufficient documentation

## 2015-08-08 DIAGNOSIS — H9193 Unspecified hearing loss, bilateral: Secondary | ICD-10-CM | POA: Diagnosis present

## 2015-08-08 DIAGNOSIS — E221 Hyperprolactinemia: Secondary | ICD-10-CM | POA: Insufficient documentation

## 2015-08-08 DIAGNOSIS — R590 Localized enlarged lymph nodes: Secondary | ICD-10-CM | POA: Diagnosis not present

## 2015-08-08 MED ORDER — GADOBENATE DIMEGLUMINE 529 MG/ML IV SOLN
10.0000 mL | Freq: Once | INTRAVENOUS | Status: AC | PRN
  Administered 2015-08-08: 9 mL via INTRAVENOUS

## 2015-08-09 ENCOUNTER — Telehealth: Payer: Self-pay

## 2015-08-09 NOTE — Telephone Encounter (Signed)
-----   Message from Harle Battiest, PA-C sent at 08/09/2015  8:29 AM EST ----- I notified the patient of his MRI findings.  He will contact his PCP, Deliah Boston PA-C, at [phone] for an appointment.  Please fax this report to his PCP.

## 2015-08-09 NOTE — Telephone Encounter (Signed)
Done ° ° °Mario Simmons °

## 2015-08-10 ENCOUNTER — Encounter: Payer: Self-pay | Admitting: Urology

## 2015-08-10 ENCOUNTER — Other Ambulatory Visit: Payer: BLUE CROSS/BLUE SHIELD

## 2015-08-10 DIAGNOSIS — Z79899 Other long term (current) drug therapy: Secondary | ICD-10-CM

## 2015-08-10 DIAGNOSIS — E291 Testicular hypofunction: Secondary | ICD-10-CM

## 2015-08-11 LAB — HEPATIC FUNCTION PANEL
ALBUMIN: 4.5 g/dL (ref 3.5–5.5)
ALT: 18 IU/L (ref 0–44)
AST: 20 IU/L (ref 0–40)
Alkaline Phosphatase: 73 IU/L (ref 39–117)
BILIRUBIN TOTAL: 0.9 mg/dL (ref 0.0–1.2)
BILIRUBIN, DIRECT: 0.23 mg/dL (ref 0.00–0.40)
Total Protein: 7.6 g/dL (ref 6.0–8.5)

## 2015-08-11 LAB — LIPID PANEL
CHOL/HDL RATIO: 3.7 ratio (ref 0.0–5.0)
CHOLESTEROL TOTAL: 161 mg/dL (ref 100–199)
HDL: 43 mg/dL (ref >39)
LDL CALC: 107 mg/dL — AB (ref 0–99)
TRIGLYCERIDES: 57 mg/dL (ref 0–149)
VLDL Cholesterol Cal: 11 mg/dL (ref 5–40)

## 2015-08-11 LAB — PROLACTIN: Prolactin: 24.7 ng/mL — ABNORMAL HIGH (ref 4.0–15.2)

## 2015-08-11 LAB — PSA: Prostate Specific Ag, Serum: 1.1 ng/mL (ref 0.0–4.0)

## 2015-08-11 LAB — HEMATOCRIT: Hematocrit: 41.8 % (ref 37.5–51.0)

## 2015-08-11 LAB — TSH: TSH: 1.85 u[IU]/mL (ref 0.450–4.500)

## 2015-08-11 LAB — TESTOSTERONE: Testosterone: 217 ng/dL — ABNORMAL LOW (ref 348–1197)

## 2015-08-14 ENCOUNTER — Telehealth: Payer: Self-pay

## 2015-08-14 NOTE — Telephone Encounter (Signed)
Per Casimiro Needle, have pt RTC for f/u. He will come in Fri

## 2015-08-21 ENCOUNTER — Ambulatory Visit (INDEPENDENT_AMBULATORY_CARE_PROVIDER_SITE_OTHER): Payer: BLUE CROSS/BLUE SHIELD | Admitting: Neurology

## 2015-08-21 DIAGNOSIS — G4733 Obstructive sleep apnea (adult) (pediatric): Secondary | ICD-10-CM

## 2015-08-21 DIAGNOSIS — G4710 Hypersomnia, unspecified: Secondary | ICD-10-CM

## 2015-08-21 DIAGNOSIS — R0683 Snoring: Secondary | ICD-10-CM

## 2015-08-21 DIAGNOSIS — H832X1 Labyrinthine dysfunction, right ear: Secondary | ICD-10-CM

## 2015-08-21 DIAGNOSIS — G4730 Sleep apnea, unspecified: Secondary | ICD-10-CM

## 2015-08-21 DIAGNOSIS — J0121 Acute recurrent ethmoidal sinusitis: Secondary | ICD-10-CM

## 2015-08-22 NOTE — Sleep Study (Signed)
Please see the scanned sleep study interpretation located in the Procedure tab within the Chart Review section. 

## 2015-08-30 ENCOUNTER — Telehealth: Payer: Self-pay

## 2015-08-30 DIAGNOSIS — G4733 Obstructive sleep apnea (adult) (pediatric): Secondary | ICD-10-CM

## 2015-08-30 NOTE — Telephone Encounter (Signed)
Spoke to pt and advised him that his sleep study results were reviewed by Dr. Vickey Huger. I advised pt that severe osa was seen in his study and when cpap was applied, it seemed to be effective. Dr. Vickey Huger recommends starting cpap. Pt is agreeable to starting cpap. I advised him that I would send this order to a DME and they would contact him to set it up. A follow up appt was made for 4/6 at 3:30. I advised pt to use his cpap every night for at least four hours. I advised pt to lose weight, diet, and exercise if not contraindicated by his other physicians. Pt verbalized understanding. Will send to [facility].

## 2015-09-18 ENCOUNTER — Telehealth: Payer: Self-pay

## 2015-09-18 NOTE — Telephone Encounter (Signed)
LMOM

## 2015-09-18 NOTE — Telephone Encounter (Signed)
-----   Message from Harle Battiest, PA-C sent at 09/14/2015  3:25 PM EST ----- I see where the patient had his sleep study, but I do not have notes where the MRI results were addressed with his PCP.  Did he see his PCP regarding this?  I would like those records in the chart.

## 2015-09-19 NOTE — Telephone Encounter (Signed)
Spoke with pt in reference to MRI results and PCP. Pt stated he has not seen PCP yet for MRI results. Reinforced with pt that we need a copy of his office visit dictations. Pt voiced understanding.

## 2015-10-25 ENCOUNTER — Ambulatory Visit: Payer: Self-pay | Admitting: Neurology

## 2015-11-01 ENCOUNTER — Ambulatory Visit: Payer: BLUE CROSS/BLUE SHIELD | Admitting: Neurology

## 2015-11-01 ENCOUNTER — Telehealth: Payer: Self-pay

## 2015-11-01 NOTE — Telephone Encounter (Signed)
Pt did not show for their appt with Dr. Dohmeier today.  

## 2015-11-02 ENCOUNTER — Encounter: Payer: Self-pay | Admitting: Neurology

## 2016-01-23 ENCOUNTER — Encounter: Payer: BLUE CROSS/BLUE SHIELD | Admitting: Physician Assistant

## 2016-11-29 ENCOUNTER — Encounter (HOSPITAL_COMMUNITY): Payer: Self-pay | Admitting: Emergency Medicine

## 2016-11-29 ENCOUNTER — Ambulatory Visit (HOSPITAL_COMMUNITY)
Admission: EM | Admit: 2016-11-29 | Discharge: 2016-11-29 | Disposition: A | Discharge status: Home / Self Care | Payer: Worker's Compensation | Attending: Internal Medicine | Admitting: Internal Medicine

## 2016-11-29 DIAGNOSIS — M5412 Radiculopathy, cervical region: Secondary | ICD-10-CM

## 2016-11-29 DIAGNOSIS — M7541 Impingement syndrome of right shoulder: Secondary | ICD-10-CM

## 2016-11-29 MED ORDER — NAPROXEN 500 MG PO TABS: 500.0000 mg | ORAL_TABLET | Freq: Two times a day (BID) | ORAL | 0 refills | Status: DC

## 2016-11-29 NOTE — ED Provider Notes (Signed)
[facility]    CSN: [id number] Arrival date & time: 11/29/16  1207     History   Chief Complaint Chief Complaint  Patient presents with  . Shoulder Injury    HPI Mario Simmons is a 51 y.o. male. Several days ago, was working with right arm extended overhead, had to steady a heavy/tippy object, and shoulder has been sore since. Pain is increased if he rolls over onto his right side at night. Pain is also increased with neck movements. Range of motion at the right shoulder appears to be full. Has some altered sensation in the second and third fingers of the right hand, they feel cold, maybe a little tingly. No loss of sensation. Feels like it is hard to make a fist.    HPI  Past Medical History:  Diagnosis Date  . Allergy   . Carpal tunnel syndrome   . Dry skin   . ED (erectile dysfunction)   . Hypogonadism in male     Patient Active Problem List   Diagnosis Date Noted  . Hypogonadism in male 07/31/2015  . 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 Fatigue 05/13/2011  . Decreased libido 05/13/2011  . Pain, joint, ankle, left 05/13/2011  . Ankle pain, right 05/13/2011  . CARPAL TUNNEL SYNDROME, RIGHT 01/11/2010  . DRY SKIN 12/10/2009  . ALLERGIC RHINITIS 12/06/2009    Past Surgical History:  Procedure Laterality Date  . FRACTURE SURGERY Left 2010   middle and 4th finger       Home Medications    Prior to Admission medications   Medication Sig Start Date End Date Taking? Authorizing Provider  fluticasone (FLONASE) 50 MCG/ACT nasal spray Place 2 sprays into both nostrils daily.    [provider]  meclizine (ANTIVERT) 25 MG tablet Take 1 tablet (25 mg total) by mouth 4 (four) times daily. 12/22/14   Patel-Mills, Lorelle Formosa, PA-C  naproxen (NAPROSYN) 500 MG tablet Take 1 tablet (500 mg total) by mouth 2 (two) times daily. 11/29/16   Eustace Moore, MD  Pseudoephedrine-Guaifenesin [facility] D PO) Take by mouth every 12 (twelve) hours.     [provider]    Family History Family History  Problem Relation Age of Onset  . Stroke Father   . Hypertension Father   . Healthy Mother   . Hyperlipidemia Mother   . Kidney disease Brother        1 Brother  . Diabetes Paternal Grandmother   . Healthy Child        1 Child  . Kidney disease Brother   . Prostate cancer Neg Hx     Social History Social History  Substance Use Topics  . Smoking status: Never Smoker  . Smokeless tobacco: Not on file  . Alcohol use No     Allergies   Prednisone   Review of Systems Review of Systems  All other systems reviewed and are negative.    Physical Exam Triage Vital Signs ED Triage Vitals  Enc Vitals Group     BP 11/29/16 1234 122/81     Pulse Rate 11/29/16 1234 83     Resp 11/29/16 1234 16     Temp 11/29/16 1234 98.1 F (36.7 C)     Temp Source 11/29/16 1234 Oral     SpO2 11/29/16 1234 96 %     Weight --      Height --      Pain Score 11/29/16 1243 10     Pain  Loc --    Updated Vital Signs BP 122/81 (BP Location: Left Arm)   Pulse 83   Temp 98.1 F (36.7 C) (Oral)   Resp 16   SpO2 96%   Physical Exam  Constitutional: He is oriented to person, place, and time. No distress.  Alert, nicely groomed  HENT:  Head: Atraumatic.  Eyes:  Conjugate gaze, no eye redness/drainage  Neck: Neck supple.  Cardiovascular: Normal rate.   Pulmonary/Chest: No respiratory distress.  Abdominal: He exhibits no distension.  Musculoskeletal: Normal range of motion.  Able to extend arm fully overhead actively at the right shoulder, internally and externally rotate. External rotation was somewhat painful. Neck extension increased pain, and neck rotation to the right and left increased pain. There is pain in the right trapezius muscle. Strength is 5/5 and symmetric in the bilateral upper extremities, proximal and distal. Able to make a fist with both hands.  Neurological: He is alert and oriented to person, place, and time.    Skin: Skin is warm and dry.  No cyanosis  Nursing note and vitals reviewed.    UC Treatments / Results   Procedures Procedures (including critical care time) None today  Final Clinical Impressions(s) / UC Diagnoses   Final diagnoses:  Right cervical radiculopathy  Impingement syndrome of right shoulder   Anticipate gradual improvement in right neck/shoulder discomfort over the next couple weeks.  Prescription for naprox[facility] (anti inflammatory/[facility] reliever[facility] [address]  Ice for 5-10 minutes several times daily should also help with discomfort.  Followup with Occupational Medicine for further guidance if not able to return to full duty.  New Prescriptions Discharge Medication List as of 11/29/2016  1:34 PM    START taking these medications   Details  naproxen (NAPROSYN) 500 MG tablet Take 1 tablet (500 mg total) by mouth 2 (two) times daily., Starting Sat 11/29/2016, Normal         Eustace MooreMurray, Fujie Dickison W, MD 12/02/16 [phone]

## 2016-11-29 NOTE — ED Triage Notes (Signed)
Injured shoulder on Monday at the end of the day and initially was sore.  Patient discussed with supervisor that day.  Thursday shoulder continued to be sore.  Patient has difficulty getting paperwork together at work.  Right shoulder hurts now having arm numbness, index finger and middle finger feel "like they are not there"  Patient was reaching up to adjust equipment and arm was pulled suddenly upward at the same time by equipment

## 2016-11-29 NOTE — Discharge Instructions (Addendum)
Anticipate gradual improvement in right neck/shoulder discomfort over the next couple weeks.  Prescription for naprox[facility] (anti inflammatory/[facility] reliever[facility] [address]  Ice for 5-10 minutes several times daily should also help with discomfort.  Followup with Occupational Medicine for further guidance if not able to return to full duty.

## 2016-12-17 ENCOUNTER — Other Ambulatory Visit: Payer: Self-pay | Admitting: Occupational Medicine

## 2016-12-17 ENCOUNTER — Ambulatory Visit: Payer: Self-pay

## 2016-12-17 DIAGNOSIS — G8929 Other chronic pain: Secondary | ICD-10-CM

## 2016-12-17 DIAGNOSIS — M25511 Pain in right shoulder: Principal | ICD-10-CM

## 2017-07-27 ENCOUNTER — Other Ambulatory Visit: Payer: Self-pay

## 2017-07-27 ENCOUNTER — Encounter (HOSPITAL_COMMUNITY): Payer: Self-pay | Admitting: Emergency Medicine

## 2017-07-27 ENCOUNTER — Ambulatory Visit (HOSPITAL_COMMUNITY)
Admission: EM | Admit: 2017-07-27 | Discharge: 2017-07-27 | Disposition: A | Discharge status: Home / Self Care | Payer: Self-pay

## 2017-07-27 DIAGNOSIS — R519 Headache, unspecified: Secondary | ICD-10-CM

## 2017-07-27 DIAGNOSIS — H53149 Visual discomfort, unspecified: Secondary | ICD-10-CM

## 2017-07-27 DIAGNOSIS — R51 Headache: Secondary | ICD-10-CM

## 2017-07-27 DIAGNOSIS — R110 Nausea: Secondary | ICD-10-CM

## 2017-07-27 MED ORDER — KETOROLAC TROMETHAMINE 60 MG/2ML IM SOLN
INTRAMUSCULAR | Status: AC
  Filled 2017-07-27: qty 2

## 2017-07-27 MED ORDER — KETOROLAC TROMETHAMINE 60 MG/2ML IM SOLN
60.0000 mg | Freq: Once | INTRAMUSCULAR | Status: AC
  Administered 2017-07-27: 60 mg via INTRAMUSCULAR

## 2017-07-27 MED ORDER — ONDANSETRON 4 MG PO TBDP
ORAL_TABLET | ORAL | Status: AC
  Filled 2017-07-27: qty 2

## 2017-07-27 MED ORDER — ONDANSETRON 8 MG PO TBDP: 8.0000 mg | ORAL_TABLET | Freq: Three times a day (TID) | ORAL | 0 refills | Status: DC | PRN

## 2017-07-27 MED ORDER — ONDANSETRON 4 MG PO TBDP
8.0000 mg | ORAL_TABLET | Freq: Once | ORAL | Status: AC
  Administered 2017-07-27: 8 mg via ORAL

## 2017-07-27 NOTE — ED Triage Notes (Signed)
Took medicine at 9 am.  And had a headache all day.  Patient has also felt queasy.  Patient says this is gabapentin.  Says recently this medicine was increased

## 2017-07-27 NOTE — Discharge Instructions (Signed)
You may take 500mg  Tylenol with ibuprofen 400-600mg  with food every 6 hours for pain and inflammation. Hydrate very well with at least 1 gallon of water daily.

## 2017-07-27 NOTE — ED Provider Notes (Signed)
  MRN: [id number] DOB: 1965-11-20  Subjective:   Venida JarvisSean Weidemann is a 52 y.o. male presenting for onset today of constant pounding headache with associated photophobia, nausea without vomiting, malaise. He started gabapentin today and is worried he is having an allergic reaction. Has not tried any medications for relief. Denies fever, sinus pain, congestion, ear pain, sore throat, cough, facial swelling, chest pain, chest tightness, shob, wheezing, abdominal pain, rashes. Denies smoking cigarettes. Denies history of migraines.  Gregary SignsSean is allergic to prednisone.  Gregary SignsSean  has a past medical history of Allergy, Carpal tunnel syndrome, Dry skin, ED (erectile dysfunction), and Hypogonadism in male. Also  has a past surgical history that includes Fracture surgery (Left, 2010).  Objective:   Vitals: BP 111/78 (BP Location: Left Arm)   Pulse 85   Temp 98.8 F (37.1 C) (Oral)   Resp 16   SpO2 98%   Physical Exam  Constitutional: He is oriented to person, place, and time. He appears well-developed and well-nourished.  HENT:  TM's intact bilaterally, no effusions or erythema. Nasal turbinates pink, dry, nasal passages patent. No sinus tenderness. Oropharynx clear, mucous membranes moist.  Eyes: EOM are normal. Pupils are equal, round, and reactive to light. Right eye exhibits no discharge. Left eye exhibits no discharge.  Neck: Normal range of motion. Neck supple.  Cardiovascular: Normal rate, regular rhythm and intact distal pulses. Exam reveals no gallop and no friction rub.  No murmur heard. Pulmonary/Chest: No respiratory distress. He has no wheezes. He has no rales.  Lymphadenopathy:    He has no cervical adenopathy.  Neurological: He is alert and oriented to person, place, and time. He displays normal reflexes. No cranial nerve deficit. Coordination normal.  Skin: Skin is warm and dry. No rash noted.  Psychiatric: He has a normal mood and affect.    Assessment and Plan :   Acute nonintractable  headache, unspecified headache type  Nausea without vomiting  Photophobia   I do not suspect drug reaction. Counseled patient on signs and symptoms of this. Used Toradol and Zofran in clinic today. Script provided for Zofran. Return-to-clinic precautions discussed, patient verbalized understanding. Otherwise, f/u with PCP for counseling on gabapentin and continued management of his worker's comp case.   Wallis BambergMani, Khristian Phillippi, [place]PA-C 07/27/17 2110

## 2017-10-10 ENCOUNTER — Emergency Department: Payer: Self-pay

## 2017-10-10 ENCOUNTER — Other Ambulatory Visit: Payer: Self-pay

## 2017-10-10 ENCOUNTER — Encounter: Payer: Self-pay | Admitting: Emergency Medicine

## 2017-10-10 ENCOUNTER — Emergency Department
Admission: EM | Admit: 2017-10-10 | Discharge: 2017-10-11 | Disposition: A | Discharge status: Home / Self Care | Payer: Self-pay | Attending: Emergency Medicine | Admitting: Emergency Medicine

## 2017-10-10 DIAGNOSIS — R51 Headache: Secondary | ICD-10-CM | POA: Insufficient documentation

## 2017-10-10 DIAGNOSIS — Z79899 Other long term (current) drug therapy: Secondary | ICD-10-CM | POA: Insufficient documentation

## 2017-10-10 DIAGNOSIS — B20 Human immunodeficiency virus [HIV] disease: Secondary | ICD-10-CM | POA: Insufficient documentation

## 2017-10-10 DIAGNOSIS — R519 Headache, unspecified: Secondary | ICD-10-CM

## 2017-10-10 HISTORY — DX: Human immunodeficiency virus (HIV) disease: B20

## 2017-10-10 HISTORY — DX: Asymptomatic human immunodeficiency virus (hiv) infection status: Z21

## 2017-10-10 LAB — CBC WITH DIFFERENTIAL/PLATELET
Basophils Absolute: 0 10*3/uL (ref 0–0.1)
Basophils Relative: 0 %
Eosinophils Absolute: 0.1 10*3/uL (ref 0–0.7)
Eosinophils Relative: 2 %
HEMATOCRIT: 36.6 % — AB (ref 40.0–52.0)
HEMOGLOBIN: 12.1 g/dL — AB (ref 13.0–18.0)
LYMPHS ABS: 1 10*3/uL (ref 1.0–3.6)
Lymphocytes Relative: 35 %
MCH: 27.5 pg (ref 26.0–34.0)
MCHC: 33 g/dL (ref 32.0–36.0)
MCV: 83.4 fL (ref 80.0–100.0)
MONOS PCT: 11 %
Monocytes Absolute: 0.3 10*3/uL (ref 0.2–1.0)
NEUTROS ABS: 1.5 10*3/uL (ref 1.4–6.5)
NEUTROS PCT: 52 %
Platelets: 176 10*3/uL (ref 150–440)
RBC: 4.39 MIL/uL — ABNORMAL LOW (ref 4.40–5.90)
RDW: 14.4 % (ref 11.5–14.5)
WBC: 2.9 10*3/uL — ABNORMAL LOW (ref 3.8–10.6)

## 2017-10-10 LAB — COMPREHENSIVE METABOLIC PANEL
ALK PHOS: 60 U/L (ref 38–126)
ALT: 71 U/L — ABNORMAL HIGH (ref 17–63)
ANION GAP: 7 (ref 5–15)
AST: 60 U/L — ABNORMAL HIGH (ref 15–41)
Albumin: 3.9 g/dL (ref 3.5–5.0)
BILIRUBIN TOTAL: 0.9 mg/dL (ref 0.3–1.2)
BUN: 7 mg/dL (ref 6–20)
CALCIUM: 9 mg/dL (ref 8.9–10.3)
CO2: 25 mmol/L (ref 22–32)
Chloride: 112 mmol/L — ABNORMAL HIGH (ref 101–111)
Creatinine, Ser: 0.86 mg/dL (ref 0.61–1.24)
GFR calc non Af Amer: >60 mL/min (ref >60)
GLUCOSE: 108 mg/dL — AB (ref 65–99)
Potassium: 3.6 mmol/L (ref 3.5–5.1)
Sodium: 144 mmol/L (ref 135–145)
TOTAL PROTEIN: 7.5 g/dL (ref 6.5–8.1)

## 2017-10-10 NOTE — ED Triage Notes (Signed)
Pt to ED via POV. Pt states that he has been having headaches x 3 weeks. Pt states that [facility] and they called him today and told him to come to the ED because something came back abnormal. Pt is unsure what came back.

## 2017-10-10 NOTE — ED Triage Notes (Signed)
Patient ambulatory to stat desk in no acute distress. Patient states that he had fallen asleep and did not hear his name called.

## 2017-10-10 NOTE — ED Notes (Signed)
Pt to CT via stretcher

## 2017-10-10 NOTE — ED Provider Notes (Signed)
[facility] Emergency Department Provider Note   ____________________________________________   First MD Initiated Contact with Patient 10/10/17 2317     (approximate)  I have reviewed the triage vital signs and the nursing notes.   HISTORY  Chief Complaint Headache    HPI Mario Simmons is a 52 y.o. male who presents to the ED from home for a CT scan of his head.  Patient reports recent diagnosis of HIV.  Seen by Dr. Sampson GoonFitzgerald from infectious disease last Monday.  Told he had thrush and was started on "2 pills".  Today he was called by the nurse about a low CD4 level of 48 and instructed to come to the ED for a CT scan of his head.  Patient has had waxing/waning gradual onset frontal headaches for the past 3 weeks.  Describes them as "tired".  Does not characterize his headaches as sharp, throbbing, dull or aching.  Denies associated neck pain, vision changes, photophobia, extremity weakness, numbness or tingling.  Denies associated fever, chills, chest pain, shortness of breath, abdominal pain, nausea or vomiting.  Denies slurred speech or disorientation.  Denies recent travel or trauma.   Past Medical History:  Diagnosis Date  . Allergy   . Carpal tunnel syndrome   . Dry skin   . ED (erectile dysfunction)   . HIV (human immunodeficiency virus infection) (HCC)   . Hypogonadism in male     Patient Active Problem List   Diagnosis Date Noted  . Hypogonadism in male 07/31/2015  . 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 Fatigue 05/13/2011  . Decreased libido 05/13/2011  . Pain, joint, ankle, left 05/13/2011  . Ankle pain, right 05/13/2011  . CARPAL TUNNEL SYNDROME, RIGHT 01/11/2010  . DRY SKIN 12/10/2009  . ALLERGIC RHINITIS 12/06/2009    Past Surgical History:  Procedure Laterality Date  . FRACTURE SURGERY Left 2010   middle and 4th finger    Prior to Admission medications   Medication Sig Start Date End Date Taking? Authorizing  Provider  fluticasone (FLONASE) 50 MCG/ACT nasal spray Place 2 sprays into both nostrils daily.    [provider]  GABAPENTIN PO Take by mouth.    [provider]  meclizine (ANTIVERT) 25 MG tablet Take 1 tablet (25 mg total) by mouth 4 (four) times daily. 12/22/14   Patel-Mills, Lorelle FormosaHanna, PA-C  naproxen (NAPROSYN) 500 MG tablet Take 1 tablet (500 mg total) by mouth 2 (two) times daily. 11/29/16   Isa RankinMurray, Laura Wilson, MD  ondansetron (ZOFRAN-ODT) 8 MG disintegrating tablet Take 1 tablet (8 mg total) by mouth every 8 (eight) hours as needed for nausea. 07/27/17   Wallis BambergMani, Mario, PA-C  Pseudoephedrine-Guaifenesin [facility](MUCINEX D PO) Take by mouth every 12 (twelve) hours.    [provider]    Allergies Prednisone  Family History  Problem Relation Age of Onset  . Stroke Father   . Hypertension Father   . Healthy Mother   . Hyperlipidemia Mother   . Kidney disease Brother        1 Brother  . Diabetes Paternal Grandmother   . Healthy Child        1 Child  . Kidney disease Brother   . Prostate cancer Neg Hx   No history of migraine headache or cerebral aneurysm  Social History Social History   Tobacco Use  . Smoking status: Never Smoker  . Smokeless tobacco: Never Used  Substance Use Topics  . Alcohol use: No  . Drug use:  No    Review of Systems  Constitutional: No fever/chills. Eyes: No visual changes. ENT: No sore throat. Cardiovascular: Denies chest pain. Respiratory: Denies shortness of breath. Gastrointestinal: No abdominal pain.  No nausea, no vomiting.  No diarrhea.  No constipation. Genitourinary: Negative for dysuria. Musculoskeletal: Negative for back pain. Skin: Negative for rash. Neurological: Positive for headache.  Negative for focal weakness or numbness. Psychiatric:Positive for feeling stressed, anxious and overwhelmed at recent diagnosis.  ____________________________________________   PHYSICAL EXAM:  VITAL SIGNS: ED Triage Vitals    Enc Vitals Group     BP 10/10/17 1544 107/66     Pulse Rate 10/10/17 1544 77     Resp 10/10/17 1544 16     Temp 10/10/17 1544 99.1 F (37.3 C)     Temp Source 10/10/17 1544 Oral     SpO2 10/10/17 1544 99 %     Weight 10/10/17 1543 187 lb (84.8 kg)     Height 10/10/17 1543 5\' 9"  (1.753 m)     Head Circumference --      Peak Flow --      Pain Score 10/10/17 1543 4     Pain Loc --      Pain Edu? --      Excl. in GC? --     Constitutional: Alert and oriented. Well appearing and in no acute distress. Eyes: Conjunctivae are normal. PERRL. EOMI. Head: Atraumatic.  There is no tenderness to palpation to sinuses. Nose: No congestion/rhinnorhea. Mouth/Throat: Mucous membranes are moist.  Oropharynx non-erythematous. Neck: No stridor.  Supple neck without meningismus.  No carotid bruits. Cardiovascular: Normal rate, regular rhythm. Grossly normal heart sounds.  Good peripheral circulation. Respiratory: Normal respiratory effort.  No retractions. Lungs CTAB. Gastrointestinal: Soft and nontender. No distention. No abdominal bruits. No CVA tenderness. Musculoskeletal: No lower extremity tenderness nor edema.  No joint effusions. Neurologic: Alert and oriented x3.  CN II-XII grossly intact.  Normal speech and language. No gross focal neurologic deficits are appreciated. No gait instability. Skin:  Skin is warm, dry and intact. No rash noted.  No petechiae. Psychiatric: Mood and affect are normal. Speech and behavior are normal.  ____________________________________________   LABS (all labs ordered are listed, but only abnormal results are displayed)  Labs Reviewed  CBC WITH DIFFERENTIAL/PLATELET - Abnormal; Notable for the following components:      Result Value   WBC 2.9 (*)    RBC 4.39 (*)    Hemoglobin 12.1 (*)    HCT 36.6 (*)    All other components within normal limits  COMPREHENSIVE METABOLIC PANEL - Abnormal; Notable for the following components:   Chloride 112 (*)     Glucose, Bld 108 (*)    AST 60 (*)    ALT 71 (*)    All other components within normal limits   ____________________________________________  EKG  None ____________________________________________  RADIOLOGY  ED MD interpretation: CT head unremarkable  Official radiology report(s): Ct Head Wo Contrast  Result Date: 10/10/2017 CLINICAL DATA:  Headache for 3 weeks. Headache, chronic, neuro deficit EXAM: CT HEAD WITHOUT CONTRAST TECHNIQUE: Contiguous axial images were obtained from the base of the skull through the vertex without intravenous contrast. COMPARISON:  Brain MRI 08/08/2015 FINDINGS: Brain: Brain volume is normal for age no intracranial hemorrhage, mass effect, or midline shift. No hydrocephalus. The basilar cisterns are patent. No evidence of territorial infarct or acute ischemia. No extra-axial or intracranial fluid collection. Vascular: No hyperdense vessel or unexpected calcification. Skull: Normal. Negative for  fracture or focal lesion. Sinuses/Orbits: Resolved mastoid effusions from prior MRI. Paranasal sinuses are clear. No acute orbital abnormality. Other: None. IMPRESSION: Unremarkable head CT.  No explanation for headache. Electronically Signed   By: Rubye Oaks M.D.   On: 10/10/2017 22:57    ____________________________________________   PROCEDURES  Procedure(s) performed: None  Procedures  Critical Care performed: No  ____________________________________________   INITIAL IMPRESSION / ASSESSMENT AND PLAN / ED COURSE  As part of my medical decision making, I reviewed the following data within the electronic MEDICAL RECORD NUMBER Nursing notes reviewed and incorporated, Labs reviewed, Old chart reviewed, Radiograph reviewed , A consult was requested and obtained from this/these consultant(s) Infectious Disease and Notes from prior ED visits  52 year old male with recent diagnosis of HIV and headaches for the past 3 weeks without focal neurological deficits  sent to the ED for CT head because his CD4 level is 48.  Labs grossly unremarkable except for white count of 2.9.  Patient is afebrile, supple neck without meningismus.  CT head is unremarkable.  Do not strongly suspect bacterial meningitis.  Unfortunately Dr. Sampson Goon is not on call tonight.  Will discuss with infectious disease on-call for [facility] for further recommendations.     Clinical Course as of Oct 12 246  Sun Oct 11, 2017  0056 Tried paging Dr. Sampson Goon who is not on-call tonight.  No call back.  Spoke with Dr. Drue Second who was on-call for infectious disease at [facility].  Recommends lumbar puncture to assess opening pressure, cell count, cryptococcal antigens and fungal culture.  Also recommend serum cryptococcus antigen levels.   [JS]  0148 Had an extensive discussion with the patient regarding infectious disease recommendations of lumbar puncture.  Unfortunately, patient's ID doctor, Dr. Sampson Goon is not on call tonight.  We discussed risk/benefits of obtaining lumbar puncture and disposition based on its results.  Patient states he would rather discuss this with Dr. Sampson Goon as he has an appointment with him on Monday.  Patient verbalizes understanding that we would be checking for bacterial, viral and fungal infections that do not show up on CT scan of the head.  He verbalizes understanding that he is welcome to return at any time, and especially if his symptoms worsen or if he experiences fever, chills, photophobia, neurological deficits.  Patient politely declines the LP, citing the above reasons plus the fact that he has been in the emergency department for 10+ hours and he would not have come except for the phone call referring him to the ED for his CT scan. I will send a message directly to Dr. Sampson Goon letting him know of the patient's visit today.  Patient does agree to let us draw blood for serum cryptococcal antigen.  Strict return precautions given.  Patient verbalizes  understanding and agrees with plan of care.   [JS]    Clinical Course User Index [JS] Irean Hong, MD     ____________________________________________   FINAL CLINICAL IMPRESSION(S) / ED DIAGNOSES  Final diagnoses:  Acute nonintractable headache, unspecified headache type     ED Discharge Orders    None       Note:  This document was prepared using Dragon voice recognition software and may include unintentional dictation errors.    Irean Hong, MD 10/11/17 [phone]

## 2017-10-10 NOTE — ED Notes (Signed)
Pt resting quietly in bed waiting for MD assessment; requested something to drink but understands NPO until CT results and MD can review; side rail up x 1 with call bell in reach

## 2017-10-10 NOTE — ED Notes (Signed)
Dr. Sung into see pt. 

## 2017-10-10 NOTE — ED Notes (Addendum)
Pt recorded call with provider that called [facility] today from [facility]; provider says some of his blood work came back abnormal and the instructed him to come to the ED for a CT scan of his head;

## 2017-10-11 LAB — CRYPTOCOCCAL ANTIGEN: Crypto Ag: NEGATIVE

## 2017-10-11 NOTE — Discharge Instructions (Addendum)
Please keep your appointment on Monday with Dr. Sampson GoonFitzgerald.  Return to the ER for worsening symptoms, persistent vomiting, vision changes, lethargy, fever, chills, or other concerns.

## 2017-10-11 NOTE — ED Notes (Signed)
MD went in to speak with pt at length about lumbar puncture; she informs this nurse that pt has "politely declined" procedure;

## 2017-10-19 ENCOUNTER — Telehealth: Payer: Self-pay | Admitting: Infectious Diseases

## 2017-10-27 NOTE — Telephone Encounter (Signed)
d 

## 2017-12-10 ENCOUNTER — Other Ambulatory Visit (HOSPITAL_COMMUNITY): Payer: Self-pay | Admitting: Family Medicine

## 2017-12-10 ENCOUNTER — Ambulatory Visit
Admission: RE | Admit: 2017-12-10 | Discharge: 2017-12-10 | Disposition: A | Discharge status: Home / Self Care | Payer: Self-pay | Source: Ambulatory Visit | Attending: Family Medicine | Admitting: Family Medicine

## 2017-12-10 DIAGNOSIS — I889 Nonspecific lymphadenitis, unspecified: Secondary | ICD-10-CM

## 2020-04-18 ENCOUNTER — Ambulatory Visit: Payer: Self-pay | Admitting: *Deleted

## 2020-04-18 NOTE — Telephone Encounter (Signed)
Pt called stating that he is experiencing shortness of breath due to covid. He states that it is getting worse and is becoming almost impossible. Please advise.   Reason for Disposition  [1] MODERATE difficulty breathing (e.g., speaks in phrases, SOB even at rest, pulse 100-120) AND [2] NEW-onset or WORSE than normal  Answer Assessment - Initial Assessment Questions 1. RESPIRATORY STATUS: "Describe your breathing?" (e.g., wheezing, shortness of breath, unable to speak, severe coughing)      SOB- labored 2. ONSET: "When did this breathing problem begin?"      3 days- worse today 3. PATTERN "Does the difficult breathing come and go, or has it been constant since it started?"      constant 4. SEVERITY: "How bad is your breathing?" (e.g., mild, moderate, severe)    - MILD: No SOB at rest, mild SOB with walking, speaks normally in sentences, can lay down, no retractions, pulse < 100.    - MODERATE: SOB at rest, SOB with minimal exertion and prefers to sit, cannot lie down flat, speaks in phrases, mild retractions, audible wheezing, pulse 100-120.    - SEVERE: Very SOB at rest, speaks in single words, struggling to breathe, sitting hunched forward, retractions, pulse > 120      moderate 5. RECURRENT SYMPTOM: "Have you had difficulty breathing before?" If Yes, ask: "When was the last time?" and "What happened that time?"      no 6. CARDIAC HISTORY: "Do you have any history of heart disease?" (e.g., heart attack, angina, bypass surgery, angioplasty)      no 7. LUNG HISTORY: "Do you have any history of lung disease?"  (e.g., pulmonary embolus, asthma, emphysema)     no 8. CAUSE: "What do you think is causing the breathing problem?"      COVID 9. OTHER SYMPTOMS: "Do you have any other symptoms? (e.g., dizziness, runny nose, cough, chest pain, fever)     cough 10. PREGNANCY: "Is there any chance you are pregnant?" "When was your last menstrual period?"       n/a 11. TRAVEL: "Have you traveled out  of the country in the last month?" (e.g., travel history, exposures)       COVID +  Protocols used: BREATHING DIFFICULTY-A-AH

## 2020-04-19 ENCOUNTER — Encounter (HOSPITAL_COMMUNITY): Payer: Self-pay | Admitting: Emergency Medicine

## 2020-04-19 ENCOUNTER — Other Ambulatory Visit: Payer: Self-pay

## 2020-04-19 ENCOUNTER — Inpatient Hospital Stay (HOSPITAL_COMMUNITY)
Admission: EM | Admit: 2020-04-19 | Discharge: 2020-04-23 | DRG: 177 | Disposition: A | Discharge status: Home / Self Care | Payer: HRSA Program | Attending: Internal Medicine | Admitting: Internal Medicine

## 2020-04-19 ENCOUNTER — Emergency Department (HOSPITAL_COMMUNITY): Payer: HRSA Program

## 2020-04-19 DIAGNOSIS — B20 Human immunodeficiency virus [HIV] disease: Secondary | ICD-10-CM | POA: Diagnosis present

## 2020-04-19 DIAGNOSIS — R0602 Shortness of breath: Secondary | ICD-10-CM | POA: Diagnosis not present

## 2020-04-19 DIAGNOSIS — U071 COVID-19: Secondary | ICD-10-CM | POA: Diagnosis not present

## 2020-04-19 DIAGNOSIS — Z21 Asymptomatic human immunodeficiency virus [HIV] infection status: Secondary | ICD-10-CM | POA: Diagnosis present

## 2020-04-19 DIAGNOSIS — E875 Hyperkalemia: Secondary | ICD-10-CM | POA: Diagnosis present

## 2020-04-19 DIAGNOSIS — J1282 Pneumonia due to coronavirus disease 2019: Secondary | ICD-10-CM | POA: Diagnosis present

## 2020-04-19 DIAGNOSIS — T380X5A Adverse effect of glucocorticoids and synthetic analogues, initial encounter: Secondary | ICD-10-CM | POA: Diagnosis not present

## 2020-04-19 DIAGNOSIS — J9601 Acute respiratory failure with hypoxia: Secondary | ICD-10-CM | POA: Diagnosis present

## 2020-04-19 DIAGNOSIS — A0839 Other viral enteritis: Secondary | ICD-10-CM | POA: Diagnosis present

## 2020-04-19 DIAGNOSIS — Z888 Allergy status to other drugs, medicaments and biological substances status: Secondary | ICD-10-CM

## 2020-04-19 DIAGNOSIS — Z833 Family history of diabetes mellitus: Secondary | ICD-10-CM

## 2020-04-19 DIAGNOSIS — M791 Myalgia, unspecified site: Secondary | ICD-10-CM | POA: Diagnosis present

## 2020-04-19 DIAGNOSIS — Z8249 Family history of ischemic heart disease and other diseases of the circulatory system: Secondary | ICD-10-CM

## 2020-04-19 DIAGNOSIS — Z6838 Body mass index (BMI) 38.0-38.9, adult: Secondary | ICD-10-CM

## 2020-04-19 DIAGNOSIS — R739 Hyperglycemia, unspecified: Secondary | ICD-10-CM | POA: Diagnosis not present

## 2020-04-19 DIAGNOSIS — Z23 Encounter for immunization: Secondary | ICD-10-CM

## 2020-04-19 DIAGNOSIS — G4733 Obstructive sleep apnea (adult) (pediatric): Secondary | ICD-10-CM | POA: Diagnosis present

## 2020-04-19 DIAGNOSIS — E669 Obesity, unspecified: Secondary | ICD-10-CM | POA: Diagnosis present

## 2020-04-19 HISTORY — DX: COVID-19: U07.1

## 2020-04-19 LAB — LACTIC ACID, PLASMA: Lactic Acid, Venous: 1.6 mmol/L (ref 0.5–1.9)

## 2020-04-19 LAB — FERRITIN: Ferritin: 401 ng/mL — ABNORMAL HIGH (ref 24–336)

## 2020-04-19 LAB — COMPREHENSIVE METABOLIC PANEL
ALT: 21 U/L (ref 0–44)
AST: 40 U/L (ref 15–41)
Albumin: 3.7 g/dL (ref 3.5–5.0)
Alkaline Phosphatase: 70 U/L (ref 38–126)
Anion gap: 10 (ref 5–15)
BUN: 16 mg/dL (ref 6–20)
CO2: 25 mmol/L (ref 22–32)
Calcium: 9.1 mg/dL (ref 8.9–10.3)
Chloride: 101 mmol/L (ref 98–111)
Creatinine, Ser: 1.2 mg/dL (ref 0.61–1.24)
GFR calc Af Amer: >60 mL/min (ref >60)
GFR calc non Af Amer: >60 mL/min (ref >60)
Glucose, Bld: 125 mg/dL — ABNORMAL HIGH (ref 70–99)
Potassium: 5.7 mmol/L — ABNORMAL HIGH (ref 3.5–5.1)
Sodium: 136 mmol/L (ref 135–145)
Total Bilirubin: 2.1 mg/dL — ABNORMAL HIGH (ref 0.3–1.2)
Total Protein: 7.8 g/dL (ref 6.5–8.1)

## 2020-04-19 LAB — CBC WITH DIFFERENTIAL/PLATELET
Abs Immature Granulocytes: 0.02 10*3/uL (ref 0.00–0.07)
Basophils Absolute: 0 10*3/uL (ref 0.0–0.1)
Basophils Relative: 0 %
Eosinophils Absolute: 0 10*3/uL (ref 0.0–0.5)
Eosinophils Relative: 0 %
HCT: 48.9 % (ref 39.0–52.0)
Hemoglobin: 15.2 g/dL (ref 13.0–17.0)
Immature Granulocytes: 0 %
Lymphocytes Relative: 19 %
Lymphs Abs: 1.3 10*3/uL (ref 0.7–4.0)
MCH: 27.2 pg (ref 26.0–34.0)
MCHC: 31.1 g/dL (ref 30.0–36.0)
MCV: 87.5 fL (ref 80.0–100.0)
Monocytes Absolute: 0.5 10*3/uL (ref 0.1–1.0)
Monocytes Relative: 7 %
Neutro Abs: 5.3 10*3/uL (ref 1.7–7.7)
Neutrophils Relative %: 74 %
Platelets: 226 10*3/uL (ref 150–400)
RBC: 5.59 MIL/uL (ref 4.22–5.81)
RDW: 12.5 % (ref 11.5–15.5)
WBC: 7.1 10*3/uL (ref 4.0–10.5)
nRBC: 0 % (ref 0.0–0.2)

## 2020-04-19 LAB — D-DIMER, QUANTITATIVE: D-Dimer, Quant: 0.67 ug/mL-FEU — ABNORMAL HIGH (ref 0.00–0.50)

## 2020-04-19 LAB — FIBRINOGEN: Fibrinogen: 634 mg/dL — ABNORMAL HIGH (ref 210–475)

## 2020-04-19 LAB — TRIGLYCERIDES: Triglycerides: 88 mg/dL (ref <150)

## 2020-04-19 LAB — PROCALCITONIN: Procalcitonin: <0.1 ng/mL

## 2020-04-19 LAB — LACTATE DEHYDROGENASE: LDH: 263 U/L — ABNORMAL HIGH (ref 98–192)

## 2020-04-19 LAB — C-REACTIVE PROTEIN: CRP: 10.5 mg/dL — ABNORMAL HIGH (ref <1.0)

## 2020-04-19 LAB — POTASSIUM: Potassium: 4.8 mmol/L (ref 3.5–5.1)

## 2020-04-19 MED ORDER — ONDANSETRON HCL 4 MG/2ML IJ SOLN: 4.0000 mg | Freq: Four times a day (QID) | INTRAMUSCULAR | Status: DC | PRN

## 2020-04-19 MED ORDER — SODIUM CHLORIDE 0.9 % IV SOLN
200.0000 mg | Freq: Once | INTRAVENOUS | Status: AC
  Administered 2020-04-19: 200 mg via INTRAVENOUS
  Filled 2020-04-19: qty 40

## 2020-04-19 MED ORDER — INSULIN ASPART 100 UNIT/ML ~~LOC~~ SOLN
0.0000 [IU] | Freq: Three times a day (TID) | SUBCUTANEOUS | Status: DC
  Administered 2020-04-20 (×2): 3 [IU] via SUBCUTANEOUS
  Administered 2020-04-20 – 2020-04-21 (×2): 7 [IU] via SUBCUTANEOUS
  Administered 2020-04-21: 4 [IU] via SUBCUTANEOUS
  Administered 2020-04-22: 3 [IU] via SUBCUTANEOUS
  Administered 2020-04-22 (×2): 4 [IU] via SUBCUTANEOUS
  Administered 2020-04-23: 3 [IU] via SUBCUTANEOUS
  Administered 2020-04-23: 4 [IU] via SUBCUTANEOUS

## 2020-04-19 MED ORDER — LACTATED RINGERS IV BOLUS
500.0000 mL | Freq: Once | INTRAVENOUS | Status: AC
  Administered 2020-04-19: 500 mL via INTRAVENOUS

## 2020-04-19 MED ORDER — DEXAMETHASONE SODIUM PHOSPHATE 10 MG/ML IJ SOLN
6.0000 mg | Freq: Once | INTRAMUSCULAR | Status: AC
  Administered 2020-04-19: 6 mg via INTRAVENOUS
  Filled 2020-04-19: qty 1

## 2020-04-19 MED ORDER — ENOXAPARIN SODIUM 40 MG/0.4ML ~~LOC~~ SOLN
40.0000 mg | SUBCUTANEOUS | Status: DC
  Administered 2020-04-19 – 2020-04-20 (×2): 40 mg via SUBCUTANEOUS
  Filled 2020-04-19 (×2): qty 0.4

## 2020-04-19 MED ORDER — SODIUM CHLORIDE 0.9 % IV SOLN
100.0000 mg | Freq: Every day | INTRAVENOUS | Status: AC
  Administered 2020-04-20 – 2020-04-23 (×4): 100 mg via INTRAVENOUS
  Filled 2020-04-19 (×6): qty 20

## 2020-04-19 MED ORDER — GUAIFENESIN-DM 100-10 MG/5ML PO SYRP
10.0000 mL | ORAL_SOLUTION | ORAL | Status: DC | PRN
  Administered 2020-04-22: 10 mL via ORAL
  Filled 2020-04-19: qty 10

## 2020-04-19 MED ORDER — BICTEGRAVIR-EMTRICITAB-TENOFOV 50-200-25 MG PO TABS
1.0000 | ORAL_TABLET | Freq: Every day | ORAL | Status: DC
  Administered 2020-04-20 – 2020-04-23 (×4): 1 via ORAL
  Filled 2020-04-19 (×5): qty 1

## 2020-04-19 MED ORDER — DEXAMETHASONE 6 MG PO TABS
6.0000 mg | ORAL_TABLET | ORAL | Status: DC
  Administered 2020-04-20 – 2020-04-23 (×4): 6 mg via ORAL
  Filled 2020-04-19: qty 2
  Filled 2020-04-19 (×2): qty 1
  Filled 2020-04-19: qty 2

## 2020-04-19 MED ORDER — ACETAMINOPHEN 325 MG PO TABS: 650.0000 mg | ORAL_TABLET | Freq: Four times a day (QID) | ORAL | Status: DC | PRN

## 2020-04-19 MED ORDER — POLYETHYLENE GLYCOL 3350 17 G PO PACK
17.0000 g | PACK | Freq: Every day | ORAL | Status: DC | PRN
  Administered 2020-04-22 (×2): 17 g via ORAL
  Filled 2020-04-19 (×3): qty 1

## 2020-04-19 MED ORDER — HYDROCOD POLST-CPM POLST ER 10-8 MG/5ML PO SUER: 5.0000 mL | Freq: Two times a day (BID) | ORAL | Status: DC | PRN

## 2020-04-19 MED ORDER — ONDANSETRON HCL 4 MG PO TABS: 4.0000 mg | ORAL_TABLET | Freq: Four times a day (QID) | ORAL | Status: DC | PRN

## 2020-04-19 MED ORDER — ACETAMINOPHEN 500 MG PO TABS
1000.0000 mg | ORAL_TABLET | Freq: Once | ORAL | Status: AC
  Administered 2020-04-19: 16:00:00 1000 mg via ORAL
  Filled 2020-04-19: qty 2

## 2020-04-19 NOTE — ED Triage Notes (Signed)
Pt diagnosed with covid 9/23 having increase SOB with SPO2 89% RA placed on 2 L up to 95%.

## 2020-04-19 NOTE — ED Provider Notes (Signed)
[facility] EMERGENCY DEPARTMENT Provider Note   CSN: [id number] Arrival date & time: 04/19/20  1526     History Chief Complaint  Patient presents with  . Shortness of Breath    Mario Simmons is a 54 y.o. male with past medical history of HIV who presents with shortness of breath in the setting of Covid.  Patient tested positive on 9/23 and had symptoms 2 days prior.  He has had worsening shortness of breath over the past several days.  Previously having diarrhea, but this is now resolved.  SPO2 89% on room air in triage, was placed on 2 L and improved to 95%.  Patient has no prior history of blood clots and denies chest pain, palpitations.    Shortness of Breath Severity:  Severe Onset quality:  Gradual Timing:  Constant Progression:  Worsening Chronicity:  New Relieved by:  Oxygen Worsened by:  Activity and deep breathing Ineffective treatments:  None tried Associated symptoms: cough and fever   Associated symptoms: no abdominal pain, no chest pain, no ear pain, no rash, no sore throat, no vomiting and no wheezing        Past Medical History:  Diagnosis Date  . Allergy   . Carpal tunnel syndrome   . Dry skin   . ED (erectile dysfunction)   . HIV (human immunodeficiency virus infection) (HCC)   . Hypogonadism in male     Patient Active Problem List   Diagnosis Date Noted  . Pneumonia due to COVID-19 virus 04/19/2020  . Hypogonadism in male 07/31/2015  . 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 Fatigue 05/13/2011  . Decreased libido 05/13/2011  . Pain, joint, ankle, left 05/13/2011  . Ankle pain, right 05/13/2011  . CARPAL TUNNEL SYNDROME, RIGHT 01/11/2010  . DRY SKIN 12/10/2009  . ALLERGIC RHINITIS 12/06/2009    Past Surgical History:  Procedure Laterality Date  . FRACTURE SURGERY Left 2010   middle and 4th finger       Family History  Problem Relation Age of Onset  . Stroke Father   . Hypertension Father   . Healthy  Mother   . Hyperlipidemia Mother   . Kidney disease Brother        1 Brother  . Diabetes Paternal Grandmother   . Healthy Child        1 Child  . Kidney disease Brother   . Prostate cancer Neg Hx     Social History   Tobacco Use  . Smoking status: Never Smoker  . Smokeless tobacco: Never Used  Substance Use Topics  . Alcohol use: No  . Drug use: No    Home Medications Prior to Admission medications   Medication Sig Start Date End Date Taking? Authorizing Provider  acetaminophen (TYLENOL) 325 MG tablet Take 650 mg by mouth every 6 (six) hours as needed for mild pain or moderate pain.   Yes [provider]  BIKTARVY 50-200-25 MG TABS tablet Take 1 tablet by mouth daily. 03/26/20  Yes [provider]  fluticasone (FLONASE) 50 MCG/ACT nasal spray Place 2 sprays into both nostrils daily.   Yes [provider]  ibuprofen (ADVIL) 200 MG tablet Take 800 mg by mouth every 6 (six) hours as needed for fever or headache.   Yes [provider]  meclizine (ANTIVERT) 25 MG tablet Take 1 tablet (25 mg total) by mouth 4 (four) times daily. Patient taking differently: Take 25 mg by mouth 2 (two) times daily as needed for  dizziness or nausea.  12/22/14  Yes Patel-Mills, Hanna, PA-C  ondansetron (ZOFRAN-ODT) 8 MG disintegrating tablet Take 1 tablet (8 mg total) by mouth every 8 (eight) hours as needed for nausea. 07/27/17  Yes Wallis Bamberg, PA-C  Pseudoephedrine-Guaifenesin [facility] D PO) Take 20 mLs by mouth 2 (two) times daily as needed (cough).    Yes [provider]  naproxen (NAPROSYN) 500 MG tablet Take 1 tablet (500 mg total) by mouth 2 (two) times daily. Patient not taking: Reported on 04/19/2020 11/29/16   Isa Rankin, MD    Allergies    Prednisone  Review of Systems   Review of Systems  Constitutional: Positive for fever. Negative for chills.  HENT: Negative for ear pain and sore throat.   Eyes: Negative for pain and visual disturbance.    Respiratory: Positive for cough and shortness of breath. Negative for wheezing.   Cardiovascular: Negative for chest pain and palpitations.  Gastrointestinal: Negative for abdominal pain and vomiting.  Genitourinary: Negative for dysuria and hematuria.  Musculoskeletal: Negative for arthralgias and back pain.  Skin: Negative for color change and rash.  Neurological: Negative for seizures and syncope.  All other systems reviewed and are negative.   Physical Exam Updated Vital Signs BP 120/81   Pulse 80   Temp 98.3 F (36.8 C) (Oral)   Resp (!) 24   Ht 5\' 9"  (1.753 m)   Wt 117.9 kg   SpO2 94%   BMI 38.38 kg/m   Physical Exam Vitals and nursing note reviewed.  Constitutional:      Appearance: He is well-developed.  HENT:     Head: Normocephalic and atraumatic.  Eyes:     Conjunctiva/sclera: Conjunctivae normal.  Cardiovascular:     Rate and Rhythm: Normal rate and regular rhythm.     Heart sounds: No murmur heard.   Pulmonary:     Effort: Pulmonary effort is normal. No respiratory distress.     Breath sounds: Normal breath sounds.  Abdominal:     Palpations: Abdomen is soft.     Tenderness: There is no abdominal tenderness.  Musculoskeletal:     Cervical back: Neck supple.     Right lower leg: No tenderness. No edema.     Left lower leg: No tenderness. No edema.  Skin:    General: Skin is warm and dry.     Capillary Refill: Capillary refill takes 2 to 3 seconds.  Neurological:     General: No focal deficit present.     Mental Status: He is alert and oriented to person, place, and time.     ED Results / Procedures / Treatments   Labs (all labs ordered are listed, but only abnormal results are displayed) Labs Reviewed  COMPREHENSIVE METABOLIC PANEL - Abnormal; Notable for the following components:      Result Value   Potassium 5.7 (*)    Glucose, Bld 125 (*)    Total Bilirubin 2.1 (*)    All other components within normal limits  FIBRINOGEN - Abnormal;  Notable for the following components:   Fibrinogen 634 (*)    All other components within normal limits  D-DIMER, QUANTITATIVE (NOT AT [facility]) - Abnormal; Notable for the following components:   D-Dimer, Quant 0.67 (*)    All other components within normal limits  C-REACTIVE PROTEIN - Abnormal; Notable for the following components:   CRP 10.5 (*)    All other components within normal limits  FERRITIN - Abnormal; Notable for the following components:  Ferritin 401 (*)    All other components within normal limits  LACTATE DEHYDROGENASE - Abnormal; Notable for the following components:   LDH 263 (*)    All other components within normal limits  CULTURE, BLOOD (ROUTINE X 2)  CULTURE, BLOOD (ROUTINE X 2)  CBC WITH DIFFERENTIAL/PLATELET  LACTIC ACID, PLASMA  TRIGLYCERIDES  PROCALCITONIN  POTASSIUM  COMPREHENSIVE METABOLIC PANEL  C-REACTIVE PROTEIN  D-DIMER, QUANTITATIVE (NOT AT [facility])  CBC    EKG EKG Interpretation  Date/Time:  Thursday April 19 2020 15:57:44 EDT Ventricular Rate:  112 PR Interval:  130 QRS Duration: 70 QT Interval:  312 QTC Calculation: 425 R Axis:   -34 Text Interpretation: Sinus tachycardia Left axis deviation Minimal voltage criteria for LVH, may be normal variant ( R in aVL ) Abnormal ECG No old tracing to compare Confirmed by Dione BoozeGlick, David ([id number]) on 04/19/2020 11:05:58 PM   Radiology DG Chest Portable 1 View  Result Date: 04/19/2020 CLINICAL DATA:  Shortness of breath. COVID positive. EXAM: PORTABLE CHEST 1 VIEW COMPARISON:  None. FINDINGS: Mild infiltrate is seen within the left lung base. Mild linear atelectasis is noted within the right lung base. There is no evidence of a pleural effusion or pneumothorax. The heart size and mediastinal contours are within normal limits. The visualized skeletal structures are unremarkable. IMPRESSION: 1. Mild left basilar infiltrate. Electronically Signed   By: Aram Candelahaddeus  Houston M.D.   On: 04/19/2020 20:04     Procedures Procedures (including critical care time)  Medications Ordered in ED Medications  bictegravir-emtricitabine-tenofovir AF (BIKTARVY) 50-200-25 MG per tablet 1 tablet (has no administration in time range)  enoxaparin (LOVENOX) injection 40 mg (40 mg Subcutaneous Given 04/19/20 2336)  remdesivir 200 mg in sodium chloride 0.9% 250 mL IVPB (200 mg Intravenous New Bag/Given 04/19/20 2336)    Followed by  remdesivir 100 mg in sodium chloride 0.9 % 100 mL IVPB (has no administration in time range)  dexamethasone (DECADRON) tablet 6 mg (has no administration in time range)  guaiFENesin-dextromethorphan (ROBITUSSIN DM) 100-10 MG/5ML syrup 10 mL (has no administration in time range)  chlorpheniramine-HYDROcodone (TUSSIONEX) 10-8 MG/5ML suspension 5 mL (has no administration in time range)  insulin aspart (novoLOG) injection 0-20 Units (has no administration in time range)  acetaminophen (TYLENOL) tablet 650 mg (has no administration in time range)  ondansetron (ZOFRAN) tablet 4 mg (has no administration in time range)    Or  ondansetron (ZOFRAN) injection 4 mg (has no administration in time range)  polyethylene glycol (MIRALAX / GLYCOLAX) packet 17 g (has no administration in time range)  acetaminophen (TYLENOL) tablet 1,000 mg (1,000 mg Oral Given 04/19/20 1614)  dexamethasone (DECADRON) injection 6 mg (6 mg Intravenous Given 04/19/20 1836)  lactated ringers bolus 500 mL (0 mLs Intravenous Stopped 04/19/20 2036)    ED Course  I have reviewed the triage vital signs and the nursing notes.  Pertinent labs & imaging results that were available during my care of the patient were reviewed by me and considered in my medical decision making (see chart for details).    MDM Rules/Calculators/A&P                         Patient's symptoms and time course are consistent with Covid pneumonia.  Do not feel he needs a PE study at this time as his shortness of breath has been gradual over the last  several days and his D-dimer is 0.67 in the setting of active disease.  Based on his EKG and symptoms, no suspicion for ACS.  He was given Decadron and admitted for further management.  This patient is seen with Dr. Dalene Seltzer.  Final Clinical Impression(s) / ED Diagnoses Final diagnoses:  Acute hypoxemic respiratory failure due to COVID-19 [facility])    Rx / DC Orders ED Discharge Orders    None       Allayne Butcher, MD 04/20/20 9604    Alvira Monday, MD 04/20/20 1007

## 2020-04-19 NOTE — ED Notes (Signed)
covid result found in epic media

## 2020-04-19 NOTE — H&P (Addendum)
Date: 04/19/2020               Patient Name:  Mario Simmons MRN: [id number]  DOB: [birth year] Age / Sex: 54 y.o., male   PCP: Pcp, No         Medical Service: Internal Medicine Teaching Service         Attending Physician: Dr. Oswaldo Done, Marquita Palms, *    First Contact: Dr. Roylene Reason Pager: [phone]  Second Contact: Dr. Thurmon Fair Pager: [phone]       After Hours (After 5p/  First Contact Pager: [phone]  weekends / holidays): Second Contact Pager: [phone]   Chief Complaint: Shortness of breath  History of Present Illness:  [name] is a 54 yo M w/ PMH of HIV on Biktarvy, OSA presenting to [facility] w/ shortness of breath. He was in his usual state of health until he developed a sore throat on 04/09/20. 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 He mentions getting tested on the 23rs and was found to be COVID positive and shortly began to have worsening myalgais, fevers, cough with productive sputum and weakness. He tried to manage his symptoms at home with over-the-counter cough medicine but his symptoms gradually worsened. This morning he became so short of breath that he could barely walk out of his room so he came to the [facility] for evaluation.  He mentions that he is unvaccinated and had been delaying getting the vaccine due to fear of possible side effects and interactions with his Biktarvy. He states that he is adherent to his anti-retrovirals and was previously told he had undetectable viral load on his last check-up.  On review of systems, he endorse fevers, chills, light-headedness, diarrhea, loss of appetite and loss of smell. He endorse chest pain associated with cough. Denies any palpitations, hemoptysis, nausea, vomiting, constipation. He denies any headaches, focal weakness, numbness or tingling.  Meds: Biktarvy 50-200-25mg  1  tab daily  Allergies: Allergies as of 04/19/2020 - Review Complete 04/19/2020  Allergen Reaction Noted   Prednisone Other (See Comments) 12/22/2014   Past Medical History:  Diagnosis Date   Allergy    Carpal tunnel syndrome    Dry skin    ED (erectile dysfunction)    HIV (human immunodeficiency virus infection) (HCC)    Hypogonadism in male    Family History:  Uncle had heart attack in his 45s. Grandmother had diabetes  Social History: Works as a [employer]. Lives with cousin. No children. Denies any alcohol, tobacco, illicit substance use.  Review of Systems: A complete ROS was negative except as per HPI.  Physical Exam: Blood pressure 120/82, pulse 85, temperature 98.3 F (36.8 C), temperature source Oral, resp. rate (!) 22, height 5\' 9"  (1.753 m), weight 117.9 kg, SpO2 95 %.  Gen: Well-developed, well nourished, NAD HEENT: Hearing intact, EOMI, MMM, On 2L [place] Neck: supple, ROM intact CV: RRR, S1, S2 normal, No rubs, no murmurs, no gallops, no chest wall tenderness Pulm: Mild expiratory wheezes, bibasilar crackles, frequent coughs Abd: Soft, BS+, NTND, No rebound, no guarding Extm: ROM intact, Peripheral pulses intact, No peripheral edema Skin: Dry, Warm, normal turgor Neuro: AAOx3 Psych: Normal mood and affect  EKG: personally reviewed my interpretation is sinus tachycardia, left axis deviation, no ischemic changes  CXR: personally reviewed my interpretation is poor inspiratory effort, bibasilar opacities  on lung bases, no pleural effusion, no lobar consolidation  Assessment & Plan by Problem: Principal Problem:   Pneumonia due to COVID-19 virus Active Problems:   HIV (human immunodeficiency virus infection) (HCC)  [name] is a 54 yo M w/ PMH of HIV presenting to [facility] with acute hypoxic respiratory failure 2/2 COVID pneumonia  Acute respiratory failure 2/2 COVID pneumonia Present w/ acute hypoxic respiratory failure, currently requiring 2L oxygen.  Inflammatory markers elevated at CRP 10.5, D-dimer 0.67. + sick contact. Unvaccinated status. COVID +. Need admission for treatment for COVID pneumonia - Start remdesivir (day 1/5) - Start dexamethasone (day 1/10) - Trend CMP, inflammatory markers - O2 as need to keep >88 - Early ambulation, proning as tolerated, incentive spirometry - SSI while on steroids  Hyperkalemia K 5.7 on admission labs. Noted hemolysis on lab sample. No significant hyperkalemic changes on EKG - Re-check potassium  HIV On Biktarvy. No labs on care-everywhere. Sees Dr.Fitzgerald at [facility].  - C/w home meds: Biktarvy 1 tab daily  OSA Chart review shows hx of OSA confirmed on prior sleep study - CPAP qhs  DVT prophx: Lovenox Diet: CM Bowel: Miralax Code: Full  Prior to Admission Living Arrangement: Home Anticipated Discharge Location: Home Barriers to Discharge: Medical treatment  Dispo: Admit patient to Observation with expected length of stay less than 2 midnights.  Signed: Theotis Barrio, MD 04/19/2020, 9:14 PM  Pager: [phone]

## 2020-04-20 DIAGNOSIS — B20 Human immunodeficiency virus [HIV] disease: Secondary | ICD-10-CM | POA: Diagnosis present

## 2020-04-20 DIAGNOSIS — E669 Obesity, unspecified: Secondary | ICD-10-CM | POA: Diagnosis present

## 2020-04-20 DIAGNOSIS — Z833 Family history of diabetes mellitus: Secondary | ICD-10-CM | POA: Diagnosis not present

## 2020-04-20 DIAGNOSIS — Z8249 Family history of ischemic heart disease and other diseases of the circulatory system: Secondary | ICD-10-CM | POA: Diagnosis not present

## 2020-04-20 DIAGNOSIS — Z888 Allergy status to other drugs, medicaments and biological substances status: Secondary | ICD-10-CM | POA: Diagnosis not present

## 2020-04-20 DIAGNOSIS — R0602 Shortness of breath: Secondary | ICD-10-CM | POA: Diagnosis present

## 2020-04-20 DIAGNOSIS — J9601 Acute respiratory failure with hypoxia: Secondary | ICD-10-CM | POA: Diagnosis present

## 2020-04-20 DIAGNOSIS — U071 COVID-19: Secondary | ICD-10-CM | POA: Diagnosis present

## 2020-04-20 DIAGNOSIS — E875 Hyperkalemia: Secondary | ICD-10-CM | POA: Diagnosis present

## 2020-04-20 DIAGNOSIS — G4733 Obstructive sleep apnea (adult) (pediatric): Secondary | ICD-10-CM | POA: Diagnosis present

## 2020-04-20 DIAGNOSIS — Z23 Encounter for immunization: Secondary | ICD-10-CM | POA: Diagnosis not present

## 2020-04-20 DIAGNOSIS — R739 Hyperglycemia, unspecified: Secondary | ICD-10-CM | POA: Diagnosis not present

## 2020-04-20 DIAGNOSIS — Z21 Asymptomatic human immunodeficiency virus [HIV] infection status: Secondary | ICD-10-CM | POA: Diagnosis present

## 2020-04-20 DIAGNOSIS — Z6838 Body mass index (BMI) 38.0-38.9, adult: Secondary | ICD-10-CM | POA: Diagnosis not present

## 2020-04-20 DIAGNOSIS — J1282 Pneumonia due to coronavirus disease 2019: Secondary | ICD-10-CM | POA: Diagnosis present

## 2020-04-20 DIAGNOSIS — M791 Myalgia, unspecified site: Secondary | ICD-10-CM | POA: Diagnosis present

## 2020-04-20 DIAGNOSIS — T380X5A Adverse effect of glucocorticoids and synthetic analogues, initial encounter: Secondary | ICD-10-CM | POA: Diagnosis not present

## 2020-04-20 DIAGNOSIS — A0839 Other viral enteritis: Secondary | ICD-10-CM | POA: Diagnosis present

## 2020-04-20 LAB — CBC
HCT: 44 % (ref 39.0–52.0)
Hemoglobin: 14 g/dL (ref 13.0–17.0)
MCH: 27.9 pg (ref 26.0–34.0)
MCHC: 31.8 g/dL (ref 30.0–36.0)
MCV: 87.8 fL (ref 80.0–100.0)
Platelets: 208 10*3/uL (ref 150–400)
RBC: 5.01 MIL/uL (ref 4.22–5.81)
RDW: 12.7 % (ref 11.5–15.5)
WBC: 4.6 10*3/uL (ref 4.0–10.5)
nRBC: 0 % (ref 0.0–0.2)

## 2020-04-20 LAB — COMPREHENSIVE METABOLIC PANEL
ALT: 22 U/L (ref 0–44)
AST: 24 U/L (ref 15–41)
Albumin: 3.1 g/dL — ABNORMAL LOW (ref 3.5–5.0)
Alkaline Phosphatase: 63 U/L (ref 38–126)
Anion gap: 11 (ref 5–15)
BUN: 18 mg/dL (ref 6–20)
CO2: 25 mmol/L (ref 22–32)
Calcium: 8.7 mg/dL — ABNORMAL LOW (ref 8.9–10.3)
Chloride: 100 mmol/L (ref 98–111)
Creatinine, Ser: 1.2 mg/dL (ref 0.61–1.24)
GFR calc Af Amer: >60 mL/min (ref >60)
GFR calc non Af Amer: >60 mL/min (ref >60)
Glucose, Bld: 178 mg/dL — ABNORMAL HIGH (ref 70–99)
Potassium: 4.7 mmol/L (ref 3.5–5.1)
Sodium: 136 mmol/L (ref 135–145)
Total Bilirubin: 1 mg/dL (ref 0.3–1.2)
Total Protein: 7.1 g/dL (ref 6.5–8.1)

## 2020-04-20 LAB — CBG MONITORING, ED
Glucose-Capillary: 140 mg/dL — ABNORMAL HIGH (ref 70–99)
Glucose-Capillary: 145 mg/dL — ABNORMAL HIGH (ref 70–99)
Glucose-Capillary: 202 mg/dL — ABNORMAL HIGH (ref 70–99)
Glucose-Capillary: 166 mg/dL — ABNORMAL HIGH (ref 70–99)

## 2020-04-20 LAB — D-DIMER, QUANTITATIVE: D-Dimer, Quant: 0.69 ug/mL-FEU — ABNORMAL HIGH (ref 0.00–0.50)

## 2020-04-20 LAB — C-REACTIVE PROTEIN: CRP: 11.4 mg/dL — ABNORMAL HIGH (ref <1.0)

## 2020-04-20 NOTE — Progress Notes (Signed)
    Subjective:  Mario Simmons is a 54 y.o. living with HIV admitted for Covid -19 pneumonia on hospital day 0   Patient has shortness of breath and pleuritic chest pain. He is saturating well on 1 liter nasal cannula but reports becoming very short of breath with any kind of movement   Objective:  Vital signs in last 24 hours: Vitals:   04/20/20 0821 04/20/20 0911 04/20/20 1009 04/20/20 1330  BP: 116/73  118/78   Pulse: 83 93 87   Resp: 14 20 (!) 21   Temp: 98.1 F (36.7 C)  98.6 F (37 C) 98.6 F (37 C)  TempSrc: Oral  Oral Oral  SpO2: 95% 92% 92% 93%  Weight:      Height:        General: NAD, nl appearance HE: Normocephalic, atraumatic , EOMI Cardiovascular: Normal rate, regular rhythm.  No murmurs, rubs, or gallops Pulmonary : Effort normal, bibasilar rales , auscultation interrupted by frequent coughing with deep breath Abd: BS+ , non distended Skin: Warm and dry    CBC Latest Ref Rng & Units 04/20/2020 04/19/2020 10/10/2017  WBC 4.0 - 10.5 K/uL 4.6 7.1 2.9(L)  Hemoglobin 13.0 - 17.0 g/dL 42.5 95.6 12.1(L)  Hematocrit 39 - 52 % 44.0 48.9 36.6(L)  Platelets 150 - 400 K/uL 208 226 176    BMP Latest Ref Rng & Units 04/20/2020 04/19/2020 04/19/2020  Glucose 70 - 99 mg/dL 387(F) - 643(P)  BUN 6 - 20 mg/dL 18 - 16  Creatinine 2.95 - 1.24 mg/dL 1.88 - 4.16  Sodium 606 - 145 mmol/L 136 - 136  Potassium 3.5 - 5.1 mmol/L 4.7 4.8 5.7(H)  Chloride 98 - 111 mmol/L 100 - 101  CO2 22 - 32 mmol/L 25 - 25  Calcium 8.9 - 10.3 mg/dL 3.0(Z) - 9.1     Assessment/Plan:  Principal Problem:   Pneumonia due to COVID-19 virus Active Problems:   HIV (human immunodeficiency virus infection) (HCC)  Mario Simmons is a 54 year old person living with HIV who is here with acute hypoxic respiratory failure secondary to Covid pneumonia.  Acute hypoxic respiratory failure secondary to Covid pneumonia Tested positive 9/22. Patient is requiring 1 L supplemental oxygen, but reports become very  short of breath with movement.  Started on dexamethasone and remdesivir.  Patient is only on 1 L supplemental oxygen likely not benefit from immunomodulator.  -Remdesivir day 1/5 -Dexamethasone day 1/10 - Trend CMP,inflammatory markers - Recommend quarantine until 10/13  HIV Patient reports well controlled. Follows with Dr.Fitzgerald at [facility] daily  OSA - CPAP qhs  DVT prophx: Lovenox Diet: Carb modified Code: Full  Dispo: Anticipated discharge with continued clinical improvement.   Thurmon Fair, MD Internal Medicine PGY-2  Pager: [phone] After 5pm on weekdays and 1pm on weekends: On Call pager [phone]

## 2020-04-20 NOTE — ED Notes (Signed)
Report attempted 

## 2020-04-20 NOTE — ED Notes (Signed)
bfast ordered 

## 2020-04-20 NOTE — ED Notes (Signed)
Pt transferred to hospital bed, pt also given incentive spirometer and instructed on use, pt verbalized understanding.

## 2020-04-21 LAB — CBC
HCT: 42 % (ref 39.0–52.0)
Hemoglobin: 13.2 g/dL (ref 13.0–17.0)
MCH: 27.1 pg (ref 26.0–34.0)
MCHC: 31.4 g/dL (ref 30.0–36.0)
MCV: 86.2 fL (ref 80.0–100.0)
Platelets: 281 10*3/uL (ref 150–400)
RBC: 4.87 MIL/uL (ref 4.22–5.81)
RDW: 12.5 % (ref 11.5–15.5)
WBC: 11.5 10*3/uL — ABNORMAL HIGH (ref 4.0–10.5)
nRBC: 0 % (ref 0.0–0.2)

## 2020-04-21 LAB — COMPREHENSIVE METABOLIC PANEL
ALT: 21 U/L (ref 0–44)
AST: 22 U/L (ref 15–41)
Albumin: 3.1 g/dL — ABNORMAL LOW (ref 3.5–5.0)
Alkaline Phosphatase: 60 U/L (ref 38–126)
Anion gap: 9 (ref 5–15)
BUN: 23 mg/dL — ABNORMAL HIGH (ref 6–20)
CO2: 27 mmol/L (ref 22–32)
Calcium: 8.9 mg/dL (ref 8.9–10.3)
Chloride: 104 mmol/L (ref 98–111)
Creatinine, Ser: 1.09 mg/dL (ref 0.61–1.24)
GFR calc Af Amer: >60 mL/min (ref >60)
GFR calc non Af Amer: >60 mL/min (ref >60)
Glucose, Bld: 158 mg/dL — ABNORMAL HIGH (ref 70–99)
Potassium: 4.3 mmol/L (ref 3.5–5.1)
Sodium: 140 mmol/L (ref 135–145)
Total Bilirubin: 0.7 mg/dL (ref 0.3–1.2)
Total Protein: 7.1 g/dL (ref 6.5–8.1)

## 2020-04-21 LAB — CBG MONITORING, ED
Glucose-Capillary: 137 mg/dL — ABNORMAL HIGH (ref 70–99)
Glucose-Capillary: 173 mg/dL — ABNORMAL HIGH (ref 70–99)
Glucose-Capillary: 155 mg/dL — ABNORMAL HIGH (ref 70–99)

## 2020-04-21 LAB — GLUCOSE, CAPILLARY
Glucose-Capillary: 205 mg/dL — ABNORMAL HIGH (ref 70–99)
Glucose-Capillary: 196 mg/dL — ABNORMAL HIGH (ref 70–99)

## 2020-04-21 LAB — D-DIMER, QUANTITATIVE: D-Dimer, Quant: 0.76 ug/mL-FEU — ABNORMAL HIGH (ref 0.00–0.50)

## 2020-04-21 LAB — C-REACTIVE PROTEIN: CRP: 5.1 mg/dL — ABNORMAL HIGH (ref <1.0)

## 2020-04-21 MED ORDER — INFLUENZA VAC SPLIT QUAD 0.5 ML IM SUSY
0.5000 mL | PREFILLED_SYRINGE | INTRAMUSCULAR | Status: AC
  Administered 2020-04-22: 0.5 mL via INTRAMUSCULAR
  Filled 2020-04-21: qty 0.5

## 2020-04-21 MED ORDER — ENOXAPARIN SODIUM 60 MG/0.6ML ~~LOC~~ SOLN
55.0000 mg | SUBCUTANEOUS | Status: DC
  Administered 2020-04-21 – 2020-04-22 (×2): 55 mg via SUBCUTANEOUS
  Filled 2020-04-21 (×2): qty 0.6

## 2020-04-21 NOTE — Plan of Care (Signed)
  Problem: Coping: Goal: Psychosocial and spiritual needs will be supported Outcome: Progressing   Problem: Respiratory: Goal: Will maintain a patent airway Outcome: Progressing   

## 2020-04-21 NOTE — ED Notes (Signed)
MS Breakfast Ordered 

## 2020-04-21 NOTE — Progress Notes (Addendum)
    Subjective:   Kahari Critzer is a 54 y.o. living with HIV admitted for Covid -19 pneumonia on hospital day 2.  Feeling stable today.  He has no respiratory symptoms at rest, however he gets significantly dyspneic with minimal exertion moving around the room. Feels that he has some chest tightness that is pleuritic.  Working on the incentive spirometer every 20 minutes.  Moderate cough that is productive of sputum.    Objective:  Vital signs in last 24 hours: Vitals:   04/21/20 0430 04/21/20 0500 04/21/20 0515 04/21/20 0608  BP: (!) 94/56 104/70 110/72 (!) 104/57  Pulse: 64 65 65 60  Resp: 20 (!) 23 (!) 23 17  Temp:      TempSrc:      SpO2: 94% 92% 93% 93%  Weight:      Height:       Gen: Well-appearing man, lying in bed, no distress Resp: Not tachypneic, no labored breathing Psych: Anxious appearing   Assessment/Plan:  Principal Problem:   Pneumonia due to COVID-19 virus Active Problems:   HIV (human immunodeficiency virus infection) (HCC)  Mr. Bula is a 54 year old person living with HIV who is here with acute hypoxic respiratory failure secondary to Covid pneumonia.  Acute hypoxic respiratory failure secondary to Covid pneumonia: Making some improvements.  Requiring low flow supplemental oxygen at rest, has more dyspnea with exertion.  Heavy symptom burden.  Inflammatory markers are improving. -Remdesivir day 3/5 -Dexamethasone day 3 - Recommend isolation until 10/13 - Incentive spirometer throughout the day - Would like to see more out of bed activity, hopefully will get a regular hospital room today.  HIV Patient reports well controlled. Follows with Dr[facility]Fitzgerald at [facility].  We will check a CD4 count as I cannot find one in care everywhere. - Continue Biktarvy daily  OSA - CPAP qhs  DVT prophx: Lovenox adjusted for obesity, D-dimer is low and stable  Diet: Carb modified  Code: Full  Dispo: Anticipated discharge with continued clinical improvement.    For questions or orders, please call: Thurmon Fair, MD Internal Medicine PGY-2  Pager: [phone] After 5pm on weekdays and 1pm on weekends: On Call pager [phone]

## 2020-04-21 NOTE — TOC Initial Note (Addendum)
Transition of Care [facility]) - Initial/Assessment Note    Patient Details  Name: Mario Simmons MRN: [id number] Date of Birth: [birth year]  [facility]) CM/SW Contact:    Lockie Pares, RN Phone Number: 04/21/2020, 1:32 PM  Clinical Narrative:                 COVID pneumonia history of HIV , well controlled On BIktarvy daily, states last CD4 count was non-detectable. Shared that he worried on how the vaccine would interact with the HIV medication as his reasoning for not being vaccinated. D-Dimer elevated, on dextamethasonel and remdesivir.  May need oxygen post hospitalization Will need COVID clinic post hospital .Patient uninsured, , may need other resources and Match refer also to [facility] post COVID clinic for follow up.  Expected Discharge Plan: Home/Self Care Barriers to Discharge: Continued Medical Work up   Patient Goals and CMS Choice        Expected Discharge Plan and Services Expected Discharge Plan: Home/Self Care   Discharge Planning Services: CM Consult   Living arrangements for the past 2 months: Apartment                                      Prior Living Arrangements/Services Living arrangements for the past 2 months: Apartment Lives with:: Spouse Patient language and need for interpreter reviewed:: Yes        Need for Family Participation in Patient Care: Yes (Comment) Care giver support system in place?: Yes (comment)   Criminal Activity/Legal Involvement Pertinent to Current Situation/Hospitalization: No - Comment as needed  Activities of Daily Living      Permission Sought/Granted                  Emotional Assessment       Orientation: : Oriented to Self, Oriented to Place, Oriented to  Time, Oriented to Situation Alcohol / Substance Use: Not Applicable Psych Involvement: No (comment)  Admission diagnosis:  Pneumonia due to COVID-19 virus [U07.1, J12.82] Patient Active Problem List   Diagnosis  Date Noted  . HIV (human immunodeficiency virus infection) (HCC) 04/20/2020  . Pneumonia due to COVID-19 virus 04/19/2020  . Hypogonadism in male 07/31/2015  . 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 Fatigue 05/13/2011  . Decreased libido 05/13/2011  . Pain, joint, ankle, left 05/13/2011  . Ankle pain, right 05/13/2011  . CARPAL TUNNEL SYNDROME, RIGHT 01/11/2010  . DRY SKIN 12/10/2009  . ALLERGIC RHINITIS 12/06/2009   PCP:  Oneita Hurt, No Pharmacy:   [facility] [phone] - [place], [place] - [address] AT [facility] OF [address] [place] [id number] Phone: [phone] Fax: [phone]     Social Determinants of Health (SDOH) Interventions    Readmission Risk Interventions No flowsheet data found.

## 2020-04-22 LAB — COMPREHENSIVE METABOLIC PANEL
ALT: 18 U/L (ref 0–44)
AST: 19 U/L (ref 15–41)
Albumin: 3 g/dL — ABNORMAL LOW (ref 3.5–5.0)
Alkaline Phosphatase: 53 U/L (ref 38–126)
Anion gap: 8 (ref 5–15)
BUN: 18 mg/dL (ref 6–20)
CO2: 26 mmol/L (ref 22–32)
Calcium: 8.7 mg/dL — ABNORMAL LOW (ref 8.9–10.3)
Chloride: 102 mmol/L (ref 98–111)
Creatinine, Ser: 1.03 mg/dL (ref 0.61–1.24)
GFR calc Af Amer: >60 mL/min (ref >60)
GFR calc non Af Amer: >60 mL/min (ref >60)
Glucose, Bld: 177 mg/dL — ABNORMAL HIGH (ref 70–99)
Potassium: 4.2 mmol/L (ref 3.5–5.1)
Sodium: 136 mmol/L (ref 135–145)
Total Bilirubin: 0.6 mg/dL (ref 0.3–1.2)
Total Protein: 6.8 g/dL (ref 6.5–8.1)

## 2020-04-22 LAB — CBC
HCT: 41.6 % (ref 39.0–52.0)
Hemoglobin: 13.3 g/dL (ref 13.0–17.0)
MCH: 27.5 pg (ref 26.0–34.0)
MCHC: 32 g/dL (ref 30.0–36.0)
MCV: 86.1 fL (ref 80.0–100.0)
Platelets: 330 10*3/uL (ref 150–400)
RBC: 4.83 MIL/uL (ref 4.22–5.81)
RDW: 12.7 % (ref 11.5–15.5)
WBC: 10.9 10*3/uL — ABNORMAL HIGH (ref 4.0–10.5)
nRBC: 0 % (ref 0.0–0.2)

## 2020-04-22 LAB — D-DIMER, QUANTITATIVE: D-Dimer, Quant: 0.52 ug/mL-FEU — ABNORMAL HIGH (ref 0.00–0.50)

## 2020-04-22 LAB — GLUCOSE, CAPILLARY
Glucose-Capillary: 127 mg/dL — ABNORMAL HIGH (ref 70–99)
Glucose-Capillary: 155 mg/dL — ABNORMAL HIGH (ref 70–99)
Glucose-Capillary: 185 mg/dL — ABNORMAL HIGH (ref 70–99)
Glucose-Capillary: 193 mg/dL — ABNORMAL HIGH (ref 70–99)

## 2020-04-22 LAB — C-REACTIVE PROTEIN: CRP: 1.4 mg/dL — ABNORMAL HIGH (ref <1.0)

## 2020-04-22 NOTE — Progress Notes (Signed)
    Subjective:   Haynes Giannotti is a 54 y.o. living with HIV admitted for Covid -19 pneumonia on hospital day 2.  Stable and coughing less today.  He continues to have dyspnea on exertion when moving around in his room.  Continues to use the incentive spirometer.  Has not had a bowel movement since admission and he will ask for his as needed medication   Objective:  Vital signs in last 24 hours: Vitals:   04/21/20 1230 04/21/20 1426 04/21/20 2029 04/22/20 0429  BP: 104/75 115/72 126/72 120/83  Pulse: 69 70 75 69  Resp: 19 20 18 20   Temp:  98.6 F (37 C) 98.6 F (37 C) 98.4 F (36.9 C)  TempSrc:  Oral Oral Oral  SpO2: 94%  93% 92%  Weight:      Height:       General: Well-appearing man, lying in bed with no distress Pulmonary: Not tachypneic, on 1 L nasal cannula with no labored breathing  Psych: Normal mood, normal affect  Assessment/Plan:  Principal Problem:   Pneumonia due to COVID-19 virus Active Problems:   HIV (human immunodeficiency virus infection) (HCC)  Mr. Geraghty is a 54 year old person living with HIV who is here with acute hypoxic respiratory failure secondary to Covid pneumonia.  Acute hypoxic respiratory failure secondary to Covid pneumonia:  Patient continues to make improvements and looks less anxious today about his condition.  He is on low-flow submental oxygen and says he only experiences dyspnea on exertion.  Inflammatory markers continue to downtrend. -Remdesivir day 4/5 -Dexamethasone day 4 - Recommend isolation until 10/13 - Incentive spirometer throughout the day - Encouraged to get up in chair  HIV Patient reports well controlled. Follows with Dr.Fitzgerald at [facility]  CD4 count and viral load pending - Continue Biktarvy daily -Follow-up CD4 count and viral load  OSA - CPAP qhs  DVT prophx: Lovenox adjusted for obesity, D-dimer is low and stable  Diet: Carb modified  Code: Full  Dispo: Anticipated discharge with continued clinical  improvement.   For questions or orders, please call: [facility], MD Internal Medicine PGY-2  Pager: [phone] After 5pm on weekdays and 1pm on weekends: On Call pager [phone]

## 2020-04-22 NOTE — Progress Notes (Signed)
Pt placed on cpap with no complications 

## 2020-04-23 ENCOUNTER — Other Ambulatory Visit (HOSPITAL_COMMUNITY): Payer: Self-pay | Admitting: Internal Medicine

## 2020-04-23 DIAGNOSIS — J1282 Pneumonia due to coronavirus disease 2019: Secondary | ICD-10-CM

## 2020-04-23 LAB — COMPREHENSIVE METABOLIC PANEL
ALT: 32 U/L (ref 0–44)
AST: 23 U/L (ref 15–41)
Albumin: 2.7 g/dL — ABNORMAL LOW (ref 3.5–5.0)
Alkaline Phosphatase: 51 U/L (ref 38–126)
Anion gap: 6 (ref 5–15)
BUN: 16 mg/dL (ref 6–20)
CO2: 28 mmol/L (ref 22–32)
Calcium: 8.5 mg/dL — ABNORMAL LOW (ref 8.9–10.3)
Chloride: 104 mmol/L (ref 98–111)
Creatinine, Ser: 1.03 mg/dL (ref 0.61–1.24)
GFR calc Af Amer: >60 mL/min (ref >60)
GFR calc non Af Amer: >60 mL/min (ref >60)
Glucose, Bld: 170 mg/dL — ABNORMAL HIGH (ref 70–99)
Potassium: 4.4 mmol/L (ref 3.5–5.1)
Sodium: 138 mmol/L (ref 135–145)
Total Bilirubin: 0.6 mg/dL (ref 0.3–1.2)
Total Protein: 6.3 g/dL — ABNORMAL LOW (ref 6.5–8.1)

## 2020-04-23 LAB — GLUCOSE, CAPILLARY
Glucose-Capillary: 129 mg/dL — ABNORMAL HIGH (ref 70–99)
Glucose-Capillary: 159 mg/dL — ABNORMAL HIGH (ref 70–99)

## 2020-04-23 LAB — HIV-1 RNA QUANT-NO REFLEX-BLD
HIV 1 RNA Quant: <20 copies/mL
LOG10 HIV-1 RNA: UNDETERMINED log10copy/mL

## 2020-04-23 LAB — C-REACTIVE PROTEIN: CRP: 0.7 mg/dL (ref <1.0)

## 2020-04-23 LAB — CBC
HCT: 39.2 % (ref 39.0–52.0)
Hemoglobin: 12.7 g/dL — ABNORMAL LOW (ref 13.0–17.0)
MCH: 27.9 pg (ref 26.0–34.0)
MCHC: 32.4 g/dL (ref 30.0–36.0)
MCV: 86.2 fL (ref 80.0–100.0)
Platelets: 322 10*3/uL (ref 150–400)
RBC: 4.55 MIL/uL (ref 4.22–5.81)
RDW: 12.6 % (ref 11.5–15.5)
WBC: 8.1 10*3/uL (ref 4.0–10.5)
nRBC: 0 % (ref 0.0–0.2)

## 2020-04-23 LAB — T-HELPER CELLS (CD4) COUNT (NOT AT ARMC)
CD4 % Helper T Cell: 19 % — ABNORMAL LOW (ref 33–65)
CD4 T Cell Abs: 259 /uL — ABNORMAL LOW (ref 400–1790)

## 2020-04-23 LAB — D-DIMER, QUANTITATIVE: D-Dimer, Quant: <0.27 ug/mL-FEU (ref 0.00–0.50)

## 2020-04-23 MED ORDER — DEXAMETHASONE 6 MG PO TABS: 6.0000 mg | ORAL_TABLET | ORAL | 0 refills | Status: DC

## 2020-04-23 MED FILL — DEXAMETHASONE 6 MG TABLET: 6 | 5 days supply | Qty: 5 | Fill #0

## 2020-04-23 NOTE — Progress Notes (Signed)
Subjective:   [name] is a 54 year old man with past medical history significant for HIV (on Biktarvy) who presented to [facility] for evaluation of shortness of breath found to have Covid -19 infection.  Overnight, patient placed on CPAP without complication.  This morning, he reports feeling better, but continues to endorse fatigue and shortness of breath with exertion. He states that his cough has improved and he denies fevers, chills, diarrhea, nausea, vomiting. He is interested in going home if possible with home oxygen.  Objective:  Vital signs in last 24 hours: Vitals:   04/22/20 0730 04/22/20 1206 04/22/20 2110 04/23/20 0511  BP:  114/76 121/71 112/81  Pulse:  71 74 60  Resp:  19 19 18   Temp:  98.1 F (36.7 C) 98.2 F (36.8 C) 98.1 F (36.7 C)  TempSrc:  Oral Oral Oral  SpO2: 98% 96% 92% 94%  Weight:      Height:       SpO2: 94 % O2 Flow Rate (L/min): 2 L/min  Filed Weights   04/19/20 1556 04/19/20 1601  Weight: 117.9 kg 117.9 kg   No intake or output data in the 24 hours ending 04/23/20 0739  General: Well-appearing man, lying in bed with no distress Pulmonary: Not tachypneic, on 1 L nasal cannula with no labored breathing  Psych: Normal mood, normal affect  CBC Latest Ref Rng & Units 04/23/2020 04/22/2020 04/21/2020  WBC 4.0 - 10.5 K/uL 8.1 10.9(H) 11.5(H)  Hemoglobin 13.0 - 17.0 g/dL 12.7(L) 13.3 13.2  Hematocrit 39 - 52 % 39.2 41.6 42.0  Platelets 150 - 400 K/uL 322 330 281   CMP Latest Ref Rng & Units 04/23/2020 04/22/2020 04/21/2020  Glucose 70 - 99 mg/dL 06/21/2020) 102(V) 253(G)  BUN 6 - 20 mg/dL 16 18 644(I)  Creatinine 0.61 - 1.24 mg/dL 34(V 4.25 9.56  Sodium 135 - 145 mmol/L 138 136 140  Potassium 3.5 - 5.1 mmol/L 4.4 4.2 4.3  Chloride 98 - 111 mmol/L 104 102 104  CO2 22 - 32 mmol/L 28 26 27   Calcium 8.9 - 10.3 mg/dL 3.87) ) 8.9  Total Protein 6.5 - 8.1 g/dL 6.3(L) 6.8 7.1  Total Bilirubin 0.3 - 1.2 mg/dL 0.6 0.6 0.7  Alkaline Phos 38 - 126  U/L 51 53 60  AST 15 - 41 U/L 23 19 22   ALT 0 - 44 U/L 32 18 21   D-dimer <0.27 CRP 0.7  Assessment/Plan:  Principal Problem:   Pneumonia due to COVID-19 virus Active Problems:   HIV (human immunodeficiency virus infection) (HCC)  [name] is a 54 year old man with past medical history significant for HIV (on Biktarvy) who presented to [facility] for evaluation of shortness of breath found to have Covid -19 infection.  #Acute hypoxic respiratory failure secondary to Covid: Patient continues to make improvements and looks less anxious today about his condition.  He is on low-flow submental oxygen and says he only experiences dyspnea on exertion.  Inflammatory markers continue to downtrend. He has finished his course of remdesivir and is completing day 5 of 10 of decadron today. He is saturating well at rest, however we will have to evaluate his oxygen requirements with exertion. -Remdesivir day 5/5 -Dexamethasone day 5/10 - Recommend isolation until 10/13 - Incentive spirometer throughout the day - Encouraged to get up in chair -Robitussin PRN -Tussionex PRN -Zofran PRN -Ambulate while checking oxygen  #HIV Patient reports well controlled. Follows with Dr.Fitzgerald at [facility].  CD4 count and viral load  pending -Continue Biktarvy daily -Follow-up CD4 count and viral load  #Steroid induced hyperglycemia, active -SSI  OSA, chronic - CPAP qhs  VTE ppx: Lovenox 55mg  Diet: Carb modified Code status: Full Bowel regimen: Miralax daily PRN IVF: none  For questions or orders, please call: , MD Internal Medicine PGY-1 Pager: [phone] After 5pm on weekdays and 1pm on weekends: On Call pager [phone]

## 2020-04-23 NOTE — Progress Notes (Signed)
SATURATION QUALIFICATIONS: (This note is used to comply with regulatory documentation for home oxygen)  Patient Saturations on Room Air at Rest = 92%  Patient Saturations on Room Air while Ambulating = 90%  Please briefly explain why patient needs home oxygen:  Pt maintained O2 saturations above 90% while ambulating. Pt does not appear to need Home oxygen.

## 2020-04-23 NOTE — Progress Notes (Signed)
Pt placed on cpap with no complications 

## 2020-04-23 NOTE — TOC Transition Note (Signed)
Transition of Care [facility]) - CM/SW Discharge Note   Patient Details  Name: Mario Simmons MRN: [id number] Date of Birth: 1966/06/01  [facility] CM/SW Contact:  Kermit Balo, RN Phone Number: 04/23/2020, 3:41 PM   Clinical Narrative:    Pt discharging home with self care. Pt did not qualify for home oxygen.  Pt has appt made at covid clinic for hospital follow up. Meds to be delivered to the room per [facility] pharmacy. Pt has transport home.   Final next level of care: Home/Self Care Barriers to Discharge: Inadequate or no insurance, Barriers Unresolved (comment)   Patient Goals and CMS Choice        Discharge Placement                       Discharge Plan and Services   Discharge Planning Services: CM Consult                                 Social Determinants of Health (SDOH) Interventions     Readmission Risk Interventions No flowsheet data found.

## 2020-04-23 NOTE — Discharge Instructions (Signed)
COVID-19: How to Protect Yourself and Others Know how it spreads  There is currently no vaccine to prevent coronavirus disease 2019 (COVID-19).  The best way to prevent illness is to avoid being exposed to this virus.  The virus is thought to spread mainly from person-to-person. ? Between people who are in close contact with one another (within about 6 feet). ? Through respiratory droplets produced when an infected person coughs, sneezes or talks. ? These droplets can land in the mouths or noses of people who are nearby or possibly be inhaled into the lungs. ? COVID-19 may be spread by people who are not showing symptoms. Everyone should Clean your hands often  Wash your hands often with soap and water for at least 20 seconds especially after you have been in a public place, or after blowing your nose, coughing, or sneezing.  If soap and water are not readily available, use a hand sanitizer that contains at least 60% alcohol. Cover all surfaces of your hands and rub them together until they feel dry.  Avoid touching your eyes, nose, and mouth with unwashed hands. Avoid close contact  Limit contact with others as much as possible.  Avoid close contact with people who are sick.  Put distance between yourself and other people. ? Remember that some people without symptoms may be able to spread virus. ? This is especially important for people who are at higher risk of getting very [employer].it Cover your mouth and nose with a mask when around others  You could spread COVID-19 to others even if you do not feel sick.  Everyone should wear a mask in public settings and when around people not living in their household, especially when social distancing is difficult to maintain. ? Masks should not be placed on young children under age 71, anyone who has trouble breathing, or is unconscious, incapacitated or otherwise  unable to remove the mask without assistance.  The mask is meant to protect other people in case you are infected.  Do NOT use a facemask meant for a Research scientist (physical sciences).  Continue to keep about 6 feet between yourself and others. The mask is not a substitute for social distancing. Cover coughs and sneezes  Always cover your mouth and nose with a tissue when you cough or sneeze or use the inside of your elbow.  Throw used tissues in the trash.  Immediately wash your hands with soap and water for at least 20 seconds. If soap and water are not readily available, clean your hands with a hand sanitizer that contains at least 60% alcohol. Clean and disinfect  Clean AND disinfect frequently touched surfaces daily. This includes tables, doorknobs, light switches, countertops, handles, desks, phones, keyboards, toilets, faucets, and sinks. [employer]timeonline.com  If surfaces are dirty, clean them: Use detergent or soap and water prior to disinfection.  Then, use a household disinfectant. You can see a list of EPA-registered household disinfectants here. [employer] 03/23/2019 This information is not intended to replace advice given to you by your health care provider. Make sure you discuss any questions you have with your health care provider. Document Revised: 03/31/2019 Document Reviewed: 01/27/2019 Elsevier Patient Education  2020 Elsevier Inc.  10 Things You Can Do to Manage Your COVID-19 Symptoms at Home If you have possible or confirmed COVID-19: 1. Stay home from work and school. And stay away from other public places. If you must go out, avoid using any kind of public transportation, ridesharing, or taxis. 2. Monitor your symptoms  carefully. If your symptoms get worse, call your healthcare provider immediately. 3. Get rest and stay hydrated. 4. If you have a medical appointment, call the healthcare provider ahead of  time and tell them that you have or may have COVID-19. 5. For medical emergencies, call 911 and notify the dispatch personnel that you have or may have COVID-19. 6. Cover your cough and sneezes with a tissue or use the inside of your elbow. 7. Wash your hands often with soap and water for at least 20 seconds or clean your hands with an alcohol-based hand sanitizer that contains at least 60% alcohol. 8. As much as possible, stay in a specific room and away from other people in your home. Also, you should use a separate bathroom, if available. If you need to be around other people in or outside of the home, wear a mask. 9. Avoid sharing personal items with other people in your household, like dishes, towels, and bedding. 10. Clean all surfaces that are touched often, like counters, tabletops, and doorknobs. Use household cleaning sprays or wipes according to the label instructions. [employer]Flowers.co.uk 01/19/2019 This information is not intended to replace advice given to you by your health care provider. Make sure you discuss any questions you have with your health care provider. Document Revised: 06/23/2019 Document Reviewed: 06/23/2019 Elsevier Patient Education  [employer].   Mario Simmons,  We are glad you have done well in the hospital. To finish your treatment you will go home with 5 days of steroids. Please quarantine for 21 days from onset of your symptoms/positve test result. Keep your appointment schedule on 10/28 with your primary care physician. I recommend getting the Covid-19 vaccine as we discussed. Wishing you a full recovery.

## 2020-04-23 NOTE — Plan of Care (Signed)
  Problem: Education: Goal: Knowledge of risk factors and measures for prevention of condition will improve Outcome: Progressing   Problem: Coping: Goal: Psychosocial and spiritual needs will be supported Outcome: Progressing   Problem: Respiratory: Goal: Will maintain a patent airway Outcome: Progressing   

## 2020-04-23 NOTE — Discharge Summary (Signed)
Name: Mario Simmons MRN: [id number] DOB: 1965-09-24 54 y.o. PCP: Pcp, No  Date of Admission: 04/19/2020  3:53 PM Date of Discharge: 04/23/2020 Attending Physician: No att. providers found  Discharge Diagnosis: 1. Acute hypoxemic respiratory failure due to COVID-19 [facility])    Discharge Medications: Allergies as of 04/23/2020      Reactions   Prednisone Other (See Comments)   "makes me have nightmares"      Medication List    STOP taking these medications   naproxen 500 MG tablet Commonly known as: NAPROSYN     TAKE these medications   acetaminophen 325 MG tablet Commonly known as: TYLENOL Take 650 mg by mouth every 6 (six) hours as needed for mild pain or moderate pain.   Biktarvy 50-200-25 MG Tabs tablet Generic drug: bictegravir-emtricitabine-tenofovir AF Take 1 tablet by mouth daily.   dexamethasone 6 MG tablet Commonly known as: DECADRON Take 1 tablet (6 mg total) by mouth daily for 5 days. Start taking on: April 24, 2020   fluticasone 50 MCG/ACT nasal spray Commonly known as: FLONASE Place 2 sprays into both nostrils daily.   ibuprofen 200 MG tablet Commonly known as: ADVIL Take 800 mg by mouth every 6 (six) hours as needed for fever or headache.   meclizine 25 MG tablet Commonly known as: Antivert Take 1 tablet (25 mg total) by mouth 4 (four) times daily. What changed:   when to take this  reasons to take this   MUCINEX D PO Take 20 mLs by mouth 2 (two) times daily as needed (cough).   ondansetron 8 MG disintegrating tablet Commonly known as: ZOFRAN-ODT Take 1 tablet (8 mg total) by mouth every 8 (eight) hours as needed for nausea.       Disposition and follow-up:   Mario Simmons was discharged from [facility] in Stable condition.  At the hospital follow up visit please address:  1.  Follow up any symtpoms after Covid-19 pneumonia. Patient expressed interest in Covid-19 vaccination, please discuss at visit.   2.  Labs /  imaging needed at time of follow-up: n/a  3.  Pending labs/ test needing follow-up: n/a  Follow-up Appointments:  Follow-up Information    [facility] Follow up on 05/04/2020.   Why: Your appt is at 9:30 am.  Contact information: [address] [place] [address] [phone]              Hospital Course by problem list: 1. Acute hypoxic respiratory failure secondary to Covid pneumonia:  Patient treated with 5 days of Remdesivir and sent home with prescription to finish 10 days of decadron ( on day 5/10 of decadron). He should remain on isolation until 10/13. He had a moderate case of Covid-19 requiring 2 liters of supplemental oxygen initially and quickly weaned off. Was symptomatic mostly with exertion.   Discharge Vitals:   BP 123/79 (BP Location: Left Arm)   Pulse 67   Temp 98.6 F (37 C) (Oral)   Resp 16   Ht 5\' 9"  (1.753 m)   Wt 117.9 kg   SpO2 91%   BMI 38.38 kg/m   Pertinent Labs, Studies, and Procedures:  CBC Latest Ref Rng & Units 04/23/2020 04/22/2020 04/21/2020  WBC 4.0 - 10.5 K/uL 8.1 10.9(H) 11.5(H)  Hemoglobin 13.0 - 17.0 g/dL 12.7(L) 13.3 13.2  Hematocrit 39 - 52 % 39.2 41.6 42.0  Platelets 150 - 400 K/uL 322 330 281   CMP Latest Ref Rng & Units  04/23/2020 04/22/2020 04/21/2020  Glucose 70 - 99 mg/dL 466(Z) 993(T) 701(X)  BUN 6 - 20 mg/dL 16 18 79(T)  Creatinine 0.61 - 1.24 mg/dL 9.03 0.09 2.33  Sodium 135 - 145 mmol/L 138 136 140  Potassium 3.5 - 5.1 mmol/L 4.4 4.2 4.3  Chloride 98 - 111 mmol/L 104 102 104  CO2 22 - 32 mmol/L 28 26 27   Calcium 8.9 - 10.3 mg/dL ) 0.0(T) 8.9  Total Protein 6.5 - 8.1 g/dL 6.3(L) 6.8 7.1  Total Bilirubin 0.3 - 1.2 mg/dL 0.6 0.6 0.7  Alkaline Phos 38 - 126 U/L 51 53 60  AST 15 - 41 U/L 23 19 22   ALT 0 - 44 U/L 32 18 21  CLINICAL DATA:  Shortness of breath. COVID positive.  EXAM: PORTABLE CHEST 1 VIEW  COMPARISON:  None.  FINDINGS: Mild infiltrate is seen within the  left lung base. Mild linear atelectasis is noted within the right lung base. There is no evidence of a pleural effusion or pneumothorax. The heart size and mediastinal contours are within normal limits. The visualized skeletal structures are unremarkable.  IMPRESSION: 1. Mild left basilar infiltrate.    Discharge Instructions: Discharge Instructions    Call MD for:  difficulty breathing, headache or visual disturbances   Complete by: As directed    Diet - low sodium heart healthy   Complete by: As directed    Increase activity slowly   Complete by: As directed       Signed: 6.2(U, MD 04/23/2020, 9:08 PM

## 2020-04-24 LAB — CULTURE, BLOOD (ROUTINE X 2)
Culture: NO GROWTH
Special Requests: ADEQUATE

## 2020-05-04 ENCOUNTER — Other Ambulatory Visit: Payer: Self-pay

## 2020-05-04 ENCOUNTER — Ambulatory Visit
Admission: RE | Admit: 2020-05-04 | Discharge: 2020-05-04 | Disposition: A | Discharge status: Home / Self Care | Payer: Self-pay | Source: Ambulatory Visit | Attending: Nurse Practitioner | Admitting: Nurse Practitioner

## 2020-05-04 ENCOUNTER — Ambulatory Visit (INDEPENDENT_AMBULATORY_CARE_PROVIDER_SITE_OTHER): Payer: HRSA Program | Admitting: Nurse Practitioner

## 2020-05-04 VITALS — BP 110/72 | HR 69 | Temp 97.5°F | Ht 69.0 in | Wt 236.0 lb

## 2020-05-04 DIAGNOSIS — U071 COVID-19: Secondary | ICD-10-CM | POA: Diagnosis not present

## 2020-05-04 DIAGNOSIS — J1282 Pneumonia due to coronavirus disease 2019: Secondary | ICD-10-CM

## 2020-05-04 NOTE — Assessment & Plan Note (Signed)
Brain Fog:   Stay well hydrated  Stay active  Deep breathing exercises  May start vitamin C 2,000 mg daily, vitamin D3 2,000 IU daily, Zinc 220 mg daily, and Quercetin 500 mg twice daily  May take tylenol or fever or pain  Will order chest x ray   Follow up:  Follow up in 2 weeks or sooner if needed

## 2020-05-04 NOTE — Progress Notes (Signed)
@Patient  ID: , male    DOB: Nov 24, 1965, 54 y.o.   MRN: [id number]  Chief Complaint  Patient presents with  . Hospitalization Follow-up    COVID Pos: 9/30 Hosp: 9/30-10/4 recieved infusion in hospital.. brain fog, "feels groggy"    Referring provider: No ref. provider found   54 year old male with history of allergic rhinitis and HIV.  Diagnosed with Covid on 04/19/2020.  HPI  Patient presents today for hospital follow-up/vascular care clinic visit.  Patient was admitted to the hospital on 04/19/2020 through 04/23/2020.  Patient was treated with remdesivir, Decadron, and oxygen.  Patient was discharged home from the hospital on room air.  Patient states that since discharge from hospital he has been doing well.  He denies any significant shortness of breath or cough.  He does state that he still feels groggy and has brain fog. Denies f/c/s, n/v/d, hemoptysis, PND, chest pain or edema.       Allergies  Allergen Reactions  . Prednisone Other (See Comments)    "makes me have nightmares"    Immunization History  Administered Date(s) Administered  . Influenza,inj,Quad PF,6+ Mos 04/22/2020  . Td 12/06/2009    Past Medical History:  Diagnosis Date  . Allergy   . Carpal tunnel syndrome   . Dry skin   . ED (erectile dysfunction)   . HIV (human immunodeficiency virus infection) (HCC)   . Hypogonadism in male     Tobacco History: Social History   Tobacco Use  Smoking Status Never Smoker  Smokeless Tobacco Never Used   Counseling given: Yes   Outpatient Encounter Medications as of 05/04/2020  Medication Sig  . BIKTARVY 50-200-25 MG TABS tablet Take 1 tablet by mouth daily.  05/06/2020 acetaminophen (TYLENOL) 325 MG tablet Take 650 mg by mouth every 6 (six) hours as needed for mild pain or moderate pain. (Patient not taking: Reported on 05/04/2020)  . fluticasone (FLONASE) 50 MCG/ACT nasal spray Place 2 sprays into both nostrils daily. (Patient not taking: Reported on  05/04/2020)  . ibuprofen (ADVIL) 200 MG tablet Take 800 mg by mouth every 6 (six) hours as needed for fever or headache. (Patient not taking: Reported on 05/04/2020)  . meclizine (ANTIVERT) 25 MG tablet Take 1 tablet (25 mg total) by mouth 4 (four) times daily. (Patient not taking: Reported on 05/04/2020)  . ondansetron (ZOFRAN-ODT) 8 MG disintegrating tablet Take 1 tablet (8 mg total) by mouth every 8 (eight) hours as needed for nausea. (Patient not taking: Reported on 05/04/2020)  . Pseudoephedrine-Guaifenesin (MUCINEX D PO) Take 20 mLs by mouth 2 (two) times daily as needed (cough).  (Patient not taking: Reported on 05/04/2020)   No facility-administered encounter medications on file as of 05/04/2020.     Review of Systems  Review of Systems  Constitutional: Negative.  Negative for fatigue and fever.  HENT: Negative.   Respiratory: Negative for cough and shortness of breath.   Cardiovascular: Negative.  Negative for chest pain, palpitations and leg swelling.  Gastrointestinal: Negative.   Allergic/Immunologic: Negative.   Neurological: Negative.   Psychiatric/Behavioral: Positive for decreased concentration.       Physical Exam  BP 110/72 (BP Location: Left Arm)   Pulse 69   Temp (!) 97.5 F (36.4 C)   Ht 5\' 9"  (1.753 m)   Wt 236 lb (107 kg)   SpO2 96%   BMI 34.85 kg/m   Wt Readings from Last 5 Encounters:  05/04/20 236 lb (107 kg)  04/19/20 259 lb 14.8 oz (  117.9 kg)  10/10/17 187 lb (84.8 kg)  07/31/15 238 lb (108 kg)  07/26/15 238 lb (108 kg)     Physical Exam Vitals and nursing note reviewed.  Constitutional:      General: He is not in acute distress.    Appearance: He is well-developed.  Cardiovascular:     Rate and Rhythm: Normal rate and regular rhythm.  Pulmonary:     Effort: Pulmonary effort is normal.     Breath sounds: Normal breath sounds.  Musculoskeletal:     Right lower leg: No edema.     Left lower leg: No edema.  Skin:    General: Skin is  warm and dry.  Neurological:     Mental Status: He is alert and oriented to person, place, and time.  Psychiatric:        Mood and Affect: Mood normal.        Behavior: Behavior normal.       Imaging: DG Chest Portable 1 View  Result Date: 04/19/2020 CLINICAL DATA:  Shortness of breath. COVID positive. EXAM: PORTABLE CHEST 1 VIEW COMPARISON:  None. FINDINGS: Mild infiltrate is seen within the left lung base. Mild linear atelectasis is noted within the right lung base. There is no evidence of a pleural effusion or pneumothorax. The heart size and mediastinal contours are within normal limits. The visualized skeletal structures are unremarkable. IMPRESSION: 1. Mild left basilar infiltrate. Electronically Signed   By: Aram Candela M.D.   On: 04/19/2020 20:04     Assessment & Plan:   Pneumonia due to COVID-19 virus Brain Fog:   Stay well hydrated  Stay active  Deep breathing exercises  May start vitamin C 2,000 mg daily, vitamin D3 2,000 IU daily, Zinc 220 mg daily, and Quercetin 500 mg twice daily  May take tylenol or fever or pain  Will order chest x ray   Follow up:  Follow up in 2 weeks or sooner if needed       Ivonne Andrew, NP 05/04/2020

## 2020-05-04 NOTE — Patient Instructions (Addendum)
Covid pneumonia Brain Fog:   Stay well hydrated  Stay active  Deep breathing exercises  May start vitamin C 2,000 mg daily, vitamin D3 2,000 IU daily, Zinc 220 mg daily, and Quercetin 500 mg twice daily  May take tylenol or fever or pain  Will order chest x ray   Follow up:  Follow up in 2 weeks or sooner if needed

## 2020-05-11 ENCOUNTER — Encounter: Payer: Self-pay | Admitting: Internal Medicine

## 2020-05-11 ENCOUNTER — Ambulatory Visit (HOSPITAL_COMMUNITY)
Admission: RE | Admit: 2020-05-11 | Discharge: 2020-05-11 | Disposition: A | Discharge status: Home / Self Care | Payer: Self-pay | Source: Ambulatory Visit | Attending: Internal Medicine | Admitting: Internal Medicine

## 2020-05-11 ENCOUNTER — Encounter (INDEPENDENT_AMBULATORY_CARE_PROVIDER_SITE_OTHER): Payer: Self-pay

## 2020-05-11 ENCOUNTER — Other Ambulatory Visit: Payer: Self-pay

## 2020-05-11 ENCOUNTER — Ambulatory Visit: Payer: Self-pay | Admitting: Internal Medicine

## 2020-05-11 DIAGNOSIS — J1282 Pneumonia due to coronavirus disease 2019: Secondary | ICD-10-CM | POA: Diagnosis not present

## 2020-05-11 DIAGNOSIS — U071 COVID-19: Secondary | ICD-10-CM | POA: Diagnosis not present

## 2020-05-11 DIAGNOSIS — R1084 Generalized abdominal pain: Secondary | ICD-10-CM | POA: Diagnosis not present

## 2020-05-11 DIAGNOSIS — R109 Unspecified abdominal pain: Secondary | ICD-10-CM | POA: Insufficient documentation

## 2020-05-11 DIAGNOSIS — K8000 Calculus of gallbladder with acute cholecystitis without obstruction: Secondary | ICD-10-CM | POA: Insufficient documentation

## 2020-05-11 LAB — TROPONIN I (HIGH SENSITIVITY): Troponin I (High Sensitivity): 5 ng/L (ref <18)

## 2020-05-11 MED ORDER — ALUM & MAG HYDROXIDE-SIMETH 282-87-25 MG/5ML PO SUSP: 1.0000 | Freq: Every day | ORAL | 0 refills | Status: DC | PRN

## 2020-05-11 NOTE — Progress Notes (Signed)
   CC: epigastric abdominal pain and cramping   HPI:  [name] is a 54 y.o. male with PMHx as listed below presenting for evaluation of abdominal pain and cramping for two days duration. Please see problem based charting for complete assessment and plan.  Past Medical History:  Diagnosis Date  . Allergy   . Carpal tunnel syndrome   . Dry skin   . ED (erectile dysfunction)   . HIV (human immunodeficiency virus infection) (HCC)   . Hypogonadism in male    Review of Systems:  Negative except as stated in HPI.  Physical Exam:  Vitals:   05/11/20 1431  BP: 116/82  Pulse: 78  Temp: 98 F (36.7 C)  SpO2: 98%  Weight: 238 lb 14.4 oz (108.4 kg)  Height: 5\' 9"  (1.753 m)   Physical Exam  Constitutional: Appears well-developed and well-nourished. No distress.  HENT: Normocephalic and atraumatic, EOMI, conjunctiva normal, moist mucous membranes Cardiovascular: Normal rate, regular rhythm, S1 and S2 present, no murmurs, rubs, gallops.  Distal pulses intact Respiratory: No respiratory distress, no accessory muscle use.  Effort is normal.  Lungs are clear to auscultation bilaterally. GI: Nondistended, soft, normoactive bowel sounds; tenderness to palpation in epigastric region; ventral abdominal hernia, reducible Musculoskeletal: Normal bulk and tone.  No peripheral edema noted. Neurological: Is alert and oriented x4, no apparent focal deficits noted. Skin: Warm and dry.  No rash, erythema, lesions noted. Psychiatric: Normal mood and affect. Behavior is normal. Judgment and thought content normal.    EKG: normal EKG, normal sinus rhythm, unchanged from previous tracings.   Assessment & Plan:   See Encounters Tab for problem based charting.  Patient discussed with Dr. 

## 2020-05-11 NOTE — Assessment & Plan Note (Addendum)
Patient endorses that during his admission for COVID on 9/30-10/4, he had constipation and was given Miralax resulting in a small bowel movement. He had a normal bowel movement following hospitalization. However, over the past 2 days, patient notes that he has felt worsening epigastric pain and constipation.  He endorses eating small meal including rice, chicken breast and brother and noted that he felt "full"/"stuffed" but still was not able to have a bowel movement.  He had a ginger ale last night with mild relief but endorsed ongoing "pressure". He did take a laxative last night and had a normal bowel movement this morning. However, has persistent abdominal discomfort in the epigastric region which he endorses feels like a "gas bubble".  He denies any nausea/vomiting or radiation of pain. He denies any similar feelings prior. On examination, has normoactive bowel sounds with tenderness to palpation at the epigastric region. No overlying skin erythema or rash noted. Given recent COVID infection and his age, concerns for possible cardiac etiology vs pancreatitis vs dyspepsia.  EKG obtained that showed normal sinus rhythm and was unchanged from prior EKG during hospitalization. His troponin was negative. CMP and amylase/lipase obtained to evaluate for possible pancreatitis. However, this is less likely in absence of alcohol use and without hypertriglyceridemia on recent labs. Suspect his symptoms are likely secondary to dyspepsia.  Plan: Maalox/Simethicone daily prn F/u CMP and amylase/lipase RTC if persistent symptoms     ADDENDUM: Amylase/lipase elevated to >3x ULN, concerning for pancreatitis. CMP also has elevated total bilirubin, AST/ALT and alkaline phosphatase concerning for possible gallstone pancreatitis. Patient contacted and recommended for evaluation in ED.

## 2020-05-11 NOTE — Patient Instructions (Addendum)
Mario Simmons,  It was a pleasure seeing you in clinic. Today we discussed:   Abdominal pain:  I obtained an EKG and some lab work to further evaluate this. Your EKG is reassuring that this is likely not related to the heart. At this time, I have sent a prescription for Alum/Mag hydroxide - Simethicone. Take this as needed for gas. If your symptoms worsen over the weekend, please go to the urgent care. If there is no significant improvement by Monday, please call us.   If you have any questions or concerns, please call our clinic at [phone] between 9am-5pm and after hours call [phone] and ask for the internal medicine resident on call. If you feel you are having a medical emergency please call 911.   Thank you, we look forward to helping you remain healthy!

## 2020-05-11 NOTE — Assessment & Plan Note (Signed)
Notes that he is feeling better; however does continue to have some dyspnea on exertion. Discussed that this will likely be a slow recovery. He expresses understanding

## 2020-05-12 ENCOUNTER — Encounter: Payer: Self-pay | Admitting: Internal Medicine

## 2020-05-12 LAB — CMP14 + ANION GAP
ALT: 357 IU/L — ABNORMAL HIGH (ref 0–44)
AST: 178 IU/L — ABNORMAL HIGH (ref 0–40)
Albumin/Globulin Ratio: 1.6 (ref 1.2–2.2)
Albumin: 4.1 g/dL (ref 3.8–4.9)
Alkaline Phosphatase: 257 IU/L — ABNORMAL HIGH (ref 44–121)
Anion Gap: 12 mmol/L (ref 10.0–18.0)
BUN/Creatinine Ratio: 7 — ABNORMAL LOW (ref 9–20)
BUN: 8 mg/dL (ref 6–24)
Bilirubin Total: 8.4 mg/dL — ABNORMAL HIGH (ref 0.0–1.2)
CO2: 22 mmol/L (ref 20–29)
Calcium: 9.1 mg/dL (ref 8.7–10.2)
Chloride: 107 mmol/L — ABNORMAL HIGH (ref 96–106)
Creatinine, Ser: 1.14 mg/dL (ref 0.76–1.27)
GFR calc Af Amer: 84 mL/min/{1.73_m2} (ref >59)
GFR calc non Af Amer: 73 mL/min/{1.73_m2} (ref >59)
Globulin, Total: 2.5 g/dL (ref 1.5–4.5)
Glucose: 126 mg/dL — ABNORMAL HIGH (ref 65–99)
Potassium: 4.2 mmol/L (ref 3.5–5.2)
Sodium: 141 mmol/L (ref 134–144)
Total Protein: 6.6 g/dL (ref 6.0–8.5)

## 2020-05-12 LAB — AMYLASE: Amylase: 1111 U/L (ref 31–110)

## 2020-05-12 LAB — LIPASE: Lipase: 2083 U/L — ABNORMAL HIGH (ref 13–78)

## 2020-05-13 ENCOUNTER — Emergency Department (HOSPITAL_COMMUNITY): Payer: Self-pay

## 2020-05-13 ENCOUNTER — Encounter (HOSPITAL_COMMUNITY): Payer: Self-pay | Admitting: Emergency Medicine

## 2020-05-13 ENCOUNTER — Other Ambulatory Visit: Payer: Self-pay

## 2020-05-13 ENCOUNTER — Emergency Department (HOSPITAL_COMMUNITY)
Admission: EM | Admit: 2020-05-13 | Discharge: 2020-05-13 | Disposition: A | Discharge status: Home / Self Care | Payer: Self-pay | Attending: Emergency Medicine | Admitting: Emergency Medicine

## 2020-05-13 DIAGNOSIS — K8020 Calculus of gallbladder without cholecystitis without obstruction: Secondary | ICD-10-CM

## 2020-05-13 DIAGNOSIS — B20 Human immunodeficiency virus [HIV] disease: Secondary | ICD-10-CM | POA: Insufficient documentation

## 2020-05-13 DIAGNOSIS — R7989 Other specified abnormal findings of blood chemistry: Secondary | ICD-10-CM

## 2020-05-13 DIAGNOSIS — R945 Abnormal results of liver function studies: Secondary | ICD-10-CM | POA: Insufficient documentation

## 2020-05-13 DIAGNOSIS — Z20822 Contact with and (suspected) exposure to covid-19: Secondary | ICD-10-CM | POA: Insufficient documentation

## 2020-05-13 DIAGNOSIS — K859 Acute pancreatitis without necrosis or infection, unspecified: Secondary | ICD-10-CM | POA: Insufficient documentation

## 2020-05-13 DIAGNOSIS — R7401 Elevation of levels of liver transaminase levels: Secondary | ICD-10-CM | POA: Insufficient documentation

## 2020-05-13 LAB — COMPREHENSIVE METABOLIC PANEL
ALT: 247 U/L — ABNORMAL HIGH (ref 0–44)
AST: 81 U/L — ABNORMAL HIGH (ref 15–41)
Albumin: 3.7 g/dL (ref 3.5–5.0)
Alkaline Phosphatase: 185 U/L — ABNORMAL HIGH (ref 38–126)
Anion gap: 8 (ref 5–15)
BUN: 9 mg/dL (ref 6–20)
CO2: 25 mmol/L (ref 22–32)
Calcium: 9.5 mg/dL (ref 8.9–10.3)
Chloride: 106 mmol/L (ref 98–111)
Creatinine, Ser: 1.04 mg/dL (ref 0.61–1.24)
GFR, Estimated: >60 mL/min (ref >60)
Glucose, Bld: 145 mg/dL — ABNORMAL HIGH (ref 70–99)
Potassium: 4.6 mmol/L (ref 3.5–5.1)
Sodium: 139 mmol/L (ref 135–145)
Total Bilirubin: 2.7 mg/dL — ABNORMAL HIGH (ref 0.3–1.2)
Total Protein: 7.4 g/dL (ref 6.5–8.1)

## 2020-05-13 LAB — URINALYSIS, ROUTINE W REFLEX MICROSCOPIC
Bilirubin Urine: NEGATIVE
Glucose, UA: NEGATIVE mg/dL
Hgb urine dipstick: NEGATIVE
Ketones, ur: NEGATIVE mg/dL
Leukocytes,Ua: NEGATIVE
Nitrite: NEGATIVE
Protein, ur: NEGATIVE mg/dL
Specific Gravity, Urine: 1.018 (ref 1.005–1.030)
pH: 5 (ref 5.0–8.0)

## 2020-05-13 LAB — CBC
HCT: 42.1 % (ref 39.0–52.0)
Hemoglobin: 13.4 g/dL (ref 13.0–17.0)
MCH: 28.2 pg (ref 26.0–34.0)
MCHC: 31.8 g/dL (ref 30.0–36.0)
MCV: 88.4 fL (ref 80.0–100.0)
Platelets: 191 10*3/uL (ref 150–400)
RBC: 4.76 MIL/uL (ref 4.22–5.81)
RDW: 13.8 % (ref 11.5–15.5)
WBC: 7 10*3/uL (ref 4.0–10.5)
nRBC: 0 % (ref 0.0–0.2)

## 2020-05-13 LAB — LIPASE, BLOOD: Lipase: 43 U/L (ref 11–51)

## 2020-05-13 LAB — RESPIRATORY PANEL BY RT PCR (FLU A&B, COVID)
Influenza A by PCR: NEGATIVE
Influenza B by PCR: NEGATIVE
SARS Coronavirus 2 by RT PCR: NEGATIVE

## 2020-05-13 MED ORDER — SODIUM CHLORIDE 0.9 % IV BOLUS
1000.0000 mL | Freq: Once | INTRAVENOUS | Status: AC
  Administered 2020-05-13: 1000 mL via INTRAVENOUS

## 2020-05-13 MED ORDER — MORPHINE SULFATE (PF) 4 MG/ML IV SOLN
4.0000 mg | Freq: Once | INTRAVENOUS | Status: AC
  Administered 2020-05-13: 4 mg via INTRAVENOUS
  Filled 2020-05-13: qty 1

## 2020-05-13 MED ORDER — ONDANSETRON HCL 4 MG/2ML IJ SOLN
4.0000 mg | Freq: Once | INTRAMUSCULAR | Status: AC
  Administered 2020-05-13: 4 mg via INTRAVENOUS
  Filled 2020-05-13: qty 2

## 2020-05-13 MED ORDER — ONDANSETRON HCL 4 MG PO TABS: 4.0000 mg | ORAL_TABLET | Freq: Three times a day (TID) | ORAL | 0 refills | Status: DC | PRN

## 2020-05-13 MED ORDER — OXYCODONE HCL 5 MG PO TABS: 5.0000 mg | ORAL_TABLET | ORAL | 0 refills | Status: DC | PRN

## 2020-05-13 NOTE — ED Notes (Signed)
Pt to US.

## 2020-05-13 NOTE — ED Provider Notes (Signed)
[facility] EMERGENCY DEPARTMENT Provider Note   CSN: [id number] Arrival date & time: 05/13/20  1247     History Chief Complaint  Patient presents with  . Abdominal Pain    Mario Simmons is a 54 y.o. male.  Presents to ER with concern for abdominal pain.  Patient reports that ever since he had Covid he has had some vague general abdominal discomfort, nausea.  Over the last few days however he had worsening of his abdominal pain and nausea, including a couple episodes of nonbloody nonbilious emesis.  Reports he was having severe pain on Thursday sharp stabbing upper abdomen.  He went to primary doctor on Friday who obtained blood work.  He reports that his primary doctor sent him to ER due to concerns for gallbladder or pancreas problem.  Patient reports over the past day or so he has noted marked improvement in his symptoms, still has some nausea and mild pain but has been able to tolerate fluids without significant difficulty.  No fevers.  No diarrhea.  Has felt constipated for a while.  HPI     Past Medical History:  Diagnosis Date  . Allergy   . Carpal tunnel syndrome   . Dry skin   . ED (erectile dysfunction)   . HIV (human immunodeficiency virus infection) (HCC)   . Hypogonadism in male     Patient Active Problem List   Diagnosis Date Noted  . Abdominal pain 05/11/2020  . HIV (human immunodeficiency virus infection) (HCC) 04/20/2020  . Pneumonia due to COVID-19 virus 04/19/2020  . Hypogonadism in male 07/31/2015  . 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 Fatigue 05/13/2011  . Decreased libido 05/13/2011  . Pain, joint, ankle, left 05/13/2011  . Ankle pain, right 05/13/2011  . CARPAL TUNNEL SYNDROME, RIGHT 01/11/2010  . DRY SKIN 12/10/2009  . ALLERGIC RHINITIS 12/06/2009    Past Surgical History:  Procedure Laterality Date  . FRACTURE SURGERY Left 2010   middle and 4th finger       Family History  Problem Relation Age of  Onset  . Stroke Father   . Hypertension Father   . Healthy Mother   . Hyperlipidemia Mother   . Kidney disease Brother        1 Brother  . Diabetes Paternal Grandmother   . Healthy Child        1 Child  . Kidney disease Brother   . Prostate cancer Neg Hx     Social History   Tobacco Use  . Smoking status: Never Smoker  . Smokeless tobacco: Never Used  Substance Use Topics  . Alcohol use: No  . Drug use: No    Home Medications Prior to Admission medications   Medication Sig Start Date End Date Taking? Authorizing Provider  acetaminophen (TYLENOL) 325 MG tablet Take 650 mg by mouth every 6 (six) hours as needed for mild pain or moderate pain.     [provider]  Alum & Mag Hydroxide-Simeth [phone] MG/5ML SUSP Take 1 Dose by mouth daily as needed (gas). 05/11/20   Eliezer Bottom, MD  BIKTARVY 50-200-25 MG TABS tablet Take 1 tablet by mouth daily. 03/26/20   [provider]  fluticasone (FLONASE) 50 MCG/ACT nasal spray Place 2 sprays into both nostrils daily.     [provider]  ibuprofen (ADVIL) 200 MG tablet Take 800 mg by mouth every 6 (six) hours as needed for fever or headache.     [provider]  meclizine (ANTIVERT) 25 MG tablet Take 1 tablet (25 mg total) by mouth 4 (four) times daily. 12/22/14   Patel-Mills, Lorelle Formosa, PA-C  ondansetron (ZOFRAN-ODT) 8 MG disintegrating tablet Take 1 tablet (8 mg total) by mouth every 8 (eight) hours as needed for nausea. 07/27/17   Wallis Bamberg, PA-C  Pseudoephedrine-Guaifenesin [facility] D PO) Take 20 mLs by mouth 2 (two) times daily as needed (cough).     [provider]    Allergies    Prednisone  Review of Systems   Review of Systems  Constitutional: Positive for chills and fatigue. Negative for fever.  HENT: Negative for ear pain and sore throat.   Eyes: Negative for pain and visual disturbance.  Respiratory: Negative for cough and shortness of breath.   Cardiovascular: Negative for chest  pain and palpitations.  Gastrointestinal: Positive for abdominal pain, nausea and vomiting.  Genitourinary: Negative for dysuria and hematuria.  Musculoskeletal: Negative for arthralgias and back pain.  Skin: Negative for color change and rash.  Neurological: Negative for seizures and syncope.  All other systems reviewed and are negative.   Physical Exam Updated Vital Signs BP 109/87   Pulse 86   Temp 97.7 F (36.5 C) (Oral)   Resp 19   Wt 108 kg   SpO2 99%   BMI 35.15 kg/m   Physical Exam Vitals and nursing note reviewed.  Constitutional:      Appearance: He is well-developed.  HENT:     Head: Normocephalic and atraumatic.  Eyes:     Conjunctiva/sclera: Conjunctivae normal.  Cardiovascular:     Rate and Rhythm: Normal rate and regular rhythm.     Heart sounds: No murmur heard.   Pulmonary:     Effort: Pulmonary effort is normal. No respiratory distress.     Breath sounds: Normal breath sounds.  Abdominal:     Palpations: Abdomen is soft.     Tenderness: Negative signs include McBurney's sign.     Comments: Mild right upper quadrant and epigastric tenderness, there is no rebound or guarding  Musculoskeletal:     Cervical back: Neck supple.  Skin:    General: Skin is warm and dry.  Neurological:     General: No focal deficit present.     Mental Status: He is alert.  Psychiatric:        Mood and Affect: Mood normal.     ED Results / Procedures / Treatments   Labs (all labs ordered are listed, but only abnormal results are displayed) Labs Reviewed  COMPREHENSIVE METABOLIC PANEL - Abnormal; Notable for the following components:      Result Value   Glucose, Bld 145 (*)    AST 81 (*)    ALT 247 (*)    Alkaline Phosphatase 185 (*)    Total Bilirubin 2.7 (*)    All other components within normal limits  LIPASE, BLOOD  CBC  URINALYSIS, ROUTINE W REFLEX MICROSCOPIC    EKG None  Radiology  Procedures Procedures (including critical care  time)  Medications Ordered in ED Medications  morphine 4 MG/ML injection 4 mg (4 mg Intravenous Given 05/13/20 1502)  ondansetron (ZOFRAN) injection 4 mg (4 mg Intravenous Given 05/13/20 1501)  sodium chloride 0.9 % bolus 1,000 mL (1,000 mLs Intravenous New Bag/Given 05/13/20 1502)    ED Course  I have reviewed the triage vital signs and the nursing notes.  Pertinent labs & imaging results that were available during my care of the patient were reviewed by me and considered  in my medical decision making (see chart for details).    MDM Rules/Calculators/A&P                         54 year old male presents to ER with concern for nausea, abdominal pain.  Outpatient labs drawn on 10/22 were concerning for significantly elevated lipase, T bili, mild transaminitis.  Today, his lipase has completely resolved, his LFTs are still mildly elevated but improved, T bili markedly improved though still slightly elevated.  His symptoms per history are markedly improved.  Based on symptomatology, blood work suspect patient may have had gallstone pancreatitis or other acute biliary pathology but what ever was causing his symptoms a couple days ago likely has passed and he is clinically improving.  Right upper quadrant ultrasound was ordered to further evaluate.  Provided patient some nausea and pain medicine.  While awaiting ultrasound results, patient signed out to Dr. Rush Landmark.  Dispo pending ultrasound and reassessment.  Final Clinical Impression(s) / ED Diagnoses Final diagnoses:  Acute pancreatitis, unspecified complication status, unspecified pancreatitis type  Transaminitis  Elevated LFTs    Rx / DC Orders ED Discharge Orders    None       Milagros Loll, MD 05/13/20 1521

## 2020-05-13 NOTE — ED Provider Notes (Signed)
3:52 PM Care assumed from Dr. Stevie Kern.  At time of transfer care, patient is waiting for results of ultrasound prior to determining disposition.  Patient had improvement in his LFTs but they were still elevated with elevated bilirubin.  Ultrasound returned showing equivocal imaging for acute cholecystitis.  There was gallbladder wall thickening and stones.  General surgery was called who requested we discussed with the patient if he would want his gallbladder out now or wait until outpatient setting as his symptoms and labs are slowly improving.  He does report that he was a 5 out of 10 discomfort this morning and still concerned about it.  He would rather talk to the surgeons today.  Surgery will be recalled to come see the patient to discuss further management.  He has not eaten anything today.  Covid test will be ordered presumptively for preop management.  8:01 PM General surgery came to see the patient and after discussion, patient would rather go home and follow-up as an outpatient.  Patient will give prescription for oxycodone without Tylenol due to the LFT elevation and prescription for Zofran.  Patient and family agree with plan of care.  Patient was able to tolerate p.o.  He will be encouraged to go easy on his food and maintain hydration.  Family agrees with plan of care and patient be discharged.   Clinical Impression: 1. Acute pancreatitis, unspecified complication status, unspecified pancreatitis type   2. Transaminitis   3. Elevated LFTs   4. Symptomatic cholelithiasis     Disposition: Discharge  Condition: Good  I have discussed the results, Dx and Tx plan with the pt(& family if present). He/she/they expressed understanding and agree(s) with the plan. Discharge instructions discussed at great length. Strict return precautions discussed and pt &/or family have verbalized understanding of the instructions. No further questions at time of discharge.    New Prescriptions    ONDANSETRON (ZOFRAN) 4 MG TABLET    Take 1 tablet (4 mg total) by mouth every 8 (eight) hours as needed for nausea or vomiting.   OXYCODONE (ROXICODONE) 5 MG IMMEDIATE RELEASE TABLET    Take 1 tablet (5 mg total) by mouth every 4 (four) hours as needed for severe pain.    Follow Up: [facility] [address] [place] [address] [phone] Call in 1 day(s) Please call for appointment with general surgery for close follow up. Requires cholecystectomy   [facility] EMERGENCY DEPARTMENT [address] [id number] mc [place] [address] [phone]         Jazmin Ley, Canary Brim, MD 05/13/20 2005

## 2020-05-13 NOTE — ED Notes (Signed)
Discharge instructions discussed with pt. Pt verbalized understanding. Pt stable and ambulatory. No signature pad available. 

## 2020-05-13 NOTE — ED Triage Notes (Addendum)
Pt reports COVID on 9/29.  Reports feeling like "air bubble in stomach" x 10 days with constipation.  Denies nausea and vomiting.  States he had lab work during [employer] f/u appt last week and told to come to ED for ? Pancreatitis.

## 2020-05-13 NOTE — Discharge Instructions (Signed)
Your work-up today was consistent with symptomatic cholelithiasis or a painful gallbladder due to gallstones.  Your labs were much improved compared to several days ago.  After discussion with the surgery team, they feel you are safe to be discharged home to follow-up with them in clinic this week to discuss further management.  Ultimately I anticipate you will need your gallbladder removed.  Please have a gentle diet and rest and stay hydrated.  Please use the pain medicine and nausea medicine.  Please avoid Tylenol and alcohol.  If any symptoms change or worsen, please return to the nearest emergency department.

## 2020-05-13 NOTE — Consult Note (Signed)
General Surgery Consult Note  Chief Complaint: bloating HPI: Mario Simmons is an 54 y.o. male with PMH of COVID requiring hospitalization without intubation earlier this month, who presents with bloating and abdominal pain since Thursday. He states that he was having difficulty having a BM and was bloated so took miralax and successfully had a BM. He reached out to his primary care doctor at that time and had labs drawn for a COVID recovery appointment on Thursday. He was notified over the weekend that he has lab findings concerning for pancreatitis so should present to the emergency room for follow up. He feels improved and only has mild epigastric pain now. His lipase is improved from 2,083 to 81, Bili improved 8.4 to 2.7, his wbc is 7.0. He underwent gallbladder US which revealed cholelithiasis and was equivocal for cholecystitis.   Past Medical History:  Diagnosis Date   Allergy    Carpal tunnel syndrome    Dry skin    ED (erectile dysfunction)    HIV (human immunodeficiency virus infection) (HCC)    Hypogonadism in male     Past Surgical History:  Procedure Laterality Date   FRACTURE SURGERY Left 2010   middle and 4th finger    Family History  Problem Relation Age of Onset   Stroke Father    Hypertension Father    Healthy Mother    Hyperlipidemia Mother    Kidney disease Brother        1 Brother   Diabetes Paternal Grandmother    Healthy Child        1 Child   Kidney disease Brother    Prostate cancer Neg Hx    Social History:  reports that he has never smoked. He has never used smokeless tobacco. He reports that he does not drink alcohol and does not use drugs.  Allergies:  Allergies  Allergen Reactions   Prednisone Other (See Comments)    "makes me have nightmares"    (Not in a hospital admission)   Results for orders placed or performed during the hospital encounter of 05/13/20 (from the past 48 hour(s))  Lipase, blood     Status: None    Collection Time: 05/13/20  1:08 PM  Result Value Ref Range   Lipase 43 11 - 51 U/L    Comment: Performed at [facility], [address], [place], [place] [address]  Comprehensive metabolic panel     Status: Abnormal   Collection Time: 05/13/20  1:08 PM  Result Value Ref Range   Sodium 139 135 - 145 mmol/L   Potassium 4.6 3.5 - 5.1 mmol/L   Chloride 106 98 - 111 mmol/L   CO2 25 22 - 32 mmol/L   Glucose, Bld 145 (H) 70 - 99 mg/dL    Comment: Glucose reference range applies only to samples taken after fasting for at least 8 hours.   BUN 9 6 - 20 mg/dL   Creatinine, Ser 3.81 0.61 - 1.24 mg/dL   Calcium 9.5 8.9 - 01.7 mg/dL   Total Protein 7.4 6.5 - 8.1 g/dL   Albumin 3.7 3.5 - 5.0 g/dL   AST 81 (H) 15 - 41 U/L   ALT 247 (H) 0 - 44 U/L   Alkaline Phosphatase 185 (H) 38 - 126 U/L   Total Bilirubin 2.7 (H) 0.3 - 1.2 mg/dL   GFR, Estimated >51 >02 mL/min    Comment: (NOTE) Calculated using the CKD-EPI Creatinine Equation (2021)    Anion gap 8  5 - 15    Comment: Performed at [facility], [address] [place], [place] [address]  CBC     Status: None   Collection Time: 05/13/20  1:08 PM  Result Value Ref Range   WBC 7.0 4.0 - 10.5 K/uL   RBC 4.76 4.22 - 5.81 MIL/uL   Hemoglobin 13.4 13.0 - 17.0 g/dL   HCT 38.7 39 - 52 %   MCV 88.4 80.0 - 100.0 fL   MCH 28.2 26.0 - 34.0 pg   MCHC 31.8 30.0 - 36.0 g/dL   RDW 56.4 33.2 - 95.1 %   Platelets 191 150 - 400 K/uL   nRBC 0.0 0.0 - 0.2 %    Comment: Performed at [facility], [address] [place], [place] [address]  Respiratory Panel by RT PCR (Flu A&B, Covid) - Nasopharyngeal Swab     Status: None   Collection Time: 05/13/20  3:59 PM   Specimen: Nasopharyngeal Swab  Result Value Ref Range   SARS Coronavirus 2 by RT PCR NEGATIVE NEGATIVE    Comment: (NOTE) SARS-CoV-2 target nucleic acids are NOT DETECTED.  The SARS-CoV-2 RNA is generally detectable in upper respiratoy specimens during the acute phase of  infection. The lowest concentration of SARS-CoV-2 viral copies this assay can detect is 131 copies/mL. A negative result does not preclude SARS-Cov-2 infection and should not be used as the sole basis for treatment or other patient management decisions. A negative result may occur with  improper specimen collection/handling, submission of specimen other than nasopharyngeal swab, presence of viral mutation(s) within the areas targeted by this assay, and inadequate number of viral copies (<131 copies/mL). A negative result must be combined with clinical observations, patient history, and epidemiological information. The expected result is Negative.  Fact Sheet for Patients:  https://www.moore.com/  Fact Sheet for Healthcare Providers:  https://www.young.biz/  This test is no t yet approved or cleared by the [place] FDA and  has been authorized for detection and/or diagnosis of SARS-CoV-2 by FDA under an Emergency Use Authorization (EUA). This EUA will remain  in effect (meaning this test can be used) for the duration of the COVID-19 declaration under Section 564(b)(1) of the Act, 21 U.S.C. section 360bbb-3(b)(1), unless the authorization is terminated or revoked sooner.     Influenza A by PCR NEGATIVE NEGATIVE   Influenza B by PCR NEGATIVE NEGATIVE    Comment: (NOTE) The Xpert Xpress SARS-CoV-2/FLU/RSV assay is intended as an aid in  the diagnosis of influenza from Nasopharyngeal swab specimens and  should not be used as a sole basis for treatment. Nasal washings and  aspirates are unacceptable for Xpert Xpress SARS-CoV-2/FLU/RSV  testing.  Fact Sheet for Patients: https://www.moore.com/  Fact Sheet for Healthcare Providers: https://www.young.biz/  This test is not yet approved or cleared by the [place] FDA and  has been authorized for detection and/or diagnosis of SARS-CoV-2 by  FDA  under an Emergency Use Authorization (EUA). This EUA will remain  in effect (meaning this test can be used) for the duration of the  Covid-19 declaration under Section 564(b)(1) of the Act, 21  U.S.C. section 360bbb-3(b)(1), unless the authorization is  terminated or revoked. Performed at [facility], [address] [place], [place] [address]   Urinalysis, Routine w reflex microscopic Urine, Clean Catch     Status: Abnormal   Collection Time: 05/13/20  5:24 PM  Result Value Ref Range   Color, Urine AMBER (A) YELLOW  Comment: BIOCHEMICALS MAY BE AFFECTED BY COLOR   APPearance CLEAR CLEAR   Specific Gravity, Urine 1.018 1.005 - 1.030   pH 5.0 5.0 - 8.0   Glucose, UA NEGATIVE NEGATIVE mg/dL   Hgb urine dipstick NEGATIVE NEGATIVE   Bilirubin Urine NEGATIVE NEGATIVE   Ketones, ur NEGATIVE NEGATIVE mg/dL   Protein, ur NEGATIVE NEGATIVE mg/dL   Nitrite NEGATIVE NEGATIVE   Leukocytes,Ua NEGATIVE NEGATIVE    Comment: Performed at [facility], [address], [place], [place] [address]   US Abdomen Limited  Result Date: 05/13/2020 CLINICAL DATA:  54 year old male with RIGHT UPPER quadrant abdominal pain. EXAM: ULTRASOUND ABDOMEN LIMITED RIGHT UPPER QUADRANT COMPARISON:  None. FINDINGS: Gallbladder: Multiple mobile and nonmobile gallstones are identified, the largest measuring 1 cm. Gallbladder wall thickening is present. No definite sonographic Murphy sign noted. Common bile duct: Diameter: 5 mm. No intrahepatic or extrahepatic biliary dilatation identified. Liver: No focal lesion identified. Within normal limits in parenchymal echogenicity. Portal vein is patent on color Doppler imaging with normal direction of blood flow towards the liver. Other: None. IMPRESSION: Cholelithiasis with gallbladder wall thickening but without sonographic Murphy sign - equivocal but somewhat suspicious for acute cholecystitis. No biliary dilatation. Electronically Signed   By: Harmon Pier M.D.   On:  05/13/2020 15:11    ROS  Blood pressure 109/76, pulse 69, temperature 97.7 F (36.5 C), temperature source Oral, resp. rate 17, weight 108 kg, SpO2 99 %.  Physical Examination:  General appearance - alert, well appearing, and in no distress Mental status - alert, oriented to person, place, and time HEENT: without lesion, anicteric sclera  Neck: supple, without appreciable lymphadenopathy Chest - breathing comfortably on room air, without respiratory distress Abdomen - soft, minimally tender in epigastrium, nondistended, umbilical hernia with tenderness upon reduction attempt but was unable to reduce  Extremities - no edema Lymphatic: no lymphadenopathy in the neck or groin Neuro: cranial nerves grossly intact.  Sensation intact to light touch diffusely. Psych: appropriate mood and affect, normal insight/judgment intact  Skin: warm and dry    Assessment/Plan Mario Simmons is an 54 y.o. male who presents with signs and symptoms of gallstone pancreatitis that is resolving. It is likely that he has passed a stone as his lipase, LFTs, and pain are improved. We discussed his treatment options and elected to proceed with outpatient management with close follow up in general surgery clinic to schedule a laparoscopic cholecystectomy. Of note, he also has a chronically incarcerated umbilical hernia that is asymptomatic.   - No acute surgical intervention at this time - PO challenge and home - I placed the phone number on the discharge instructions for [facility] follow up, please request close follow up to plan for cholecystectomy, can likely perform primary repair of umbilical hernia if desired  - Thank you for this consult,    Kerin Salen, MD [facility] PGY 5   General Surgery  This consult was seen in conjunction with the attending physician. Assessment and plan as stated unless otherwise noted in attending addendum.

## 2020-05-14 NOTE — Progress Notes (Signed)
Internal Medicine Clinic Attending ° °Case discussed with Dr. Aslam  At the time of the visit.  We reviewed the resident’s history and exam and pertinent patient test results.  I agree with the assessment, diagnosis, and plan of care documented in the resident’s note.  °

## 2020-05-15 ENCOUNTER — Encounter: Payer: Self-pay | Admitting: Internal Medicine

## 2020-05-15 ENCOUNTER — Ambulatory Visit (INDEPENDENT_AMBULATORY_CARE_PROVIDER_SITE_OTHER): Payer: HRSA Program | Admitting: Internal Medicine

## 2020-05-15 VITALS — BP 125/82 | HR 73 | Temp 98.3°F | Ht 69.0 in | Wt 237.7 lb

## 2020-05-15 DIAGNOSIS — U071 COVID-19: Secondary | ICD-10-CM | POA: Diagnosis not present

## 2020-05-15 DIAGNOSIS — K810 Acute cholecystitis: Secondary | ICD-10-CM | POA: Diagnosis not present

## 2020-05-15 DIAGNOSIS — J1282 Pneumonia due to coronavirus disease 2019: Secondary | ICD-10-CM

## 2020-05-15 DIAGNOSIS — K8000 Calculus of gallbladder with acute cholecystitis without obstruction: Secondary | ICD-10-CM | POA: Diagnosis not present

## 2020-05-15 DIAGNOSIS — B20 Human immunodeficiency virus [HIV] disease: Secondary | ICD-10-CM | POA: Diagnosis not present

## 2020-05-15 DIAGNOSIS — Z0000 Encounter for general adult medical examination without abnormal findings: Secondary | ICD-10-CM

## 2020-05-15 NOTE — Patient Instructions (Signed)
Mr Dann,  It was a pleasure seeing you in clinic. Today we discussed:   Acute cholecystitis: I am glad you are feeling better. Please follow up with the surgeon. If you develop worsening of your abdominal pain, gas, or have any fevers/chills, please seek emergent medical care.  COVID Vaccine: You may get this 1-3 months following infection. Please follow up with the [facility] Department for further recommendations   If you have any questions or concerns, please call our clinic at [phone] between 9am-5pm and after hours call [phone] and ask for the internal medicine resident on call. If you feel you are having a medical emergency please call 911.   Thank you, we look forward to helping you remain healthy!

## 2020-05-15 NOTE — Progress Notes (Signed)
   CC: hospital f/u; abdominal pain f/u   HPI:  Mr.Tyreese Falls is a 54 y.o. male with PMHx as listed below presenting for follow up of his recent hospitalization for COVID 19 pneumonia and also for his abdominal pain for which he was evaluated on 10/22. Due to concerns for gallstone pancreatitis, patient was referred to the ED for further evaluation. He was noted to have acute cholecystitis; however, due to improvement of symptoms, patient and family elected to defer cholecystectomy at the time. Today, patient denies any acute concerns. He is tolerating diet. He went to his surgery appointment this morning with Dr Freida Busman. Please see problem based charting for complete assessment and plan.   Past Medical History:  Diagnosis Date  . Allergy   . Carpal tunnel syndrome   . Dry skin   . ED (erectile dysfunction)   . HIV (human immunodeficiency virus infection) (HCC)   . Hypogonadism in male    Review of Systems:  Negative except as stated in HPI.  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  Patient denies any tobacco use or alcohol use. History of marijuana use in 1988; denies any further illicit drug use.   Physical Exam:  Vitals:   05/15/20 1501  BP: (!) 136/94  Pulse: 89  Temp: 98.3 F (36.8 C)  TempSrc: Oral  SpO2: 98%  Weight: 237 lb 11.2 oz (107.8 kg)  Height: 5\' 9"  (1.753 m)   Physical Exam  Constitutional: Appears well-developed and well-nourished. No distress.  HENT: Normocephalic and atraumatic, EOMI, conjunctiva normal, moist mucous membranes Cardiovascular: Normal rate, regular rhythm, S1 and S2 present, no murmurs, rubs, gallops.  Distal pulses intact Respiratory: No respiratory  distress, no accessory muscle use.  Effort is normal.  Lungs are clear to auscultation bilaterally. GI: Nondistended, soft, minimal epigastric tenderness to palpation, reducible umbilical hernia; hyperactive bowel sounds Musculoskeletal: Normal bulk and tone.  No peripheral edema noted. Neurological: Is alert and oriented x4, no apparent focal deficits noted. Skin: Warm and dry.  No rash, erythema, lesions noted. Psychiatric: Normal mood and affect. Behavior is normal. Judgment and thought content normal.    Assessment & Plan:   See Encounters Tab for problem based charting.  Patient discussed with Dr. 

## 2020-05-16 LAB — CMP14 + ANION GAP
ALT: 138 IU/L — ABNORMAL HIGH (ref 0–44)
AST: 32 IU/L (ref 0–40)
Albumin/Globulin Ratio: 1.6 (ref 1.2–2.2)
Albumin: 4.1 g/dL (ref 3.8–4.9)
Alkaline Phosphatase: 186 IU/L — ABNORMAL HIGH (ref 44–121)
Anion Gap: 14 mmol/L (ref 10.0–18.0)
BUN/Creatinine Ratio: 6 — ABNORMAL LOW (ref 9–20)
BUN: 7 mg/dL (ref 6–24)
Bilirubin Total: 1.5 mg/dL — ABNORMAL HIGH (ref 0.0–1.2)
CO2: 24 mmol/L (ref 20–29)
Calcium: 9.4 mg/dL (ref 8.7–10.2)
Chloride: 105 mmol/L (ref 96–106)
Creatinine, Ser: 1.09 mg/dL (ref 0.76–1.27)
GFR calc Af Amer: 88 mL/min/{1.73_m2} (ref >59)
GFR calc non Af Amer: 77 mL/min/{1.73_m2} (ref >59)
Globulin, Total: 2.6 g/dL (ref 1.5–4.5)
Glucose: 139 mg/dL — ABNORMAL HIGH (ref 65–99)
Potassium: 4.2 mmol/L (ref 3.5–5.2)
Sodium: 143 mmol/L (ref 134–144)
Total Protein: 6.7 g/dL (ref 6.0–8.5)

## 2020-05-17 ENCOUNTER — Telehealth: Payer: Self-pay

## 2020-05-17 DIAGNOSIS — Z0000 Encounter for general adult medical examination without abnormal findings: Secondary | ICD-10-CM | POA: Insufficient documentation

## 2020-05-17 NOTE — Assessment & Plan Note (Signed)
Patient follows up in [facility] Department for his HIV management. He is on Biktarvy and notes that he is tolerating this well.   He says that he will inquire about COVID vaccination from [facility] Department as well.   Plan: Continue Biktarvy F/u with [facility] Department

## 2020-05-17 NOTE — Assessment & Plan Note (Signed)
Patient was admitted 9/30-10/4 for acute hypoxic respiratory failure secondary to COVID pneumonia. He was treated with five days of Remdesivir and Decadron.  During his admission, he required 2L of supplemental oxygen and was able to be quickly weaned off of this. He was discharged home to complete five days of steroids and recommended to quarantine until 10/13. He was able to follow up in the COVID clinic and repeat CXR on 10/17 with almost complete resolution of the ground glass/airspace opacities. He notes that he feels much better now although still has mild dyspnea on exertion and fatigue.  Discussed that this will likely be a low recovery. He is pleased with the progress he has made thus far. He is requesting FMLA paperwork to be signed for his hospital stay and faxed to his employer. Will fill this out.  Plan: - FMLA paperwork - F/u as needed

## 2020-05-17 NOTE — Progress Notes (Signed)
Internal Medicine Clinic Attending ° °Case discussed with Dr. Aslam  At the time of the visit.  We reviewed the resident’s history and exam and pertinent patient test results.  I agree with the assessment, diagnosis, and plan of care documented in the resident’s note.  °

## 2020-05-17 NOTE — Assessment & Plan Note (Signed)
Patient presented on 10/23 with symptoms of abdominal fullness and discomfort associated with abnormal bowel movements. Examination was benign but work up significant for possible gallstone pancreatitis with amlyase and lipase >3x ULN, elevated total bilirubin, AST/ALT/ alkaline phosphatase elevation. Patient was referred to the emergency department for further evaluation over the weekend. Repeat blood work in the ED with resolving pancreatitis; however, persistent transaminitis. RUQ [place] was significant for multiple gallstones with largest measuring 1cm and gall bladder wall thickening. Negative Murphy's sign. Surgery was consulted. Due to improvement in patient's symptoms, elected for outpatient cholecystectomy.  Patient endorses that he has been able to tolerate diet since then and denies any significant discomfort. He had a visit with Dr Freida Busman, [facility], prior to this appointment, and is to be scheduled for cholecystectomy within the next week. On my examination, he has mild epigastric tenderness to deep palpation. Otherwise, his examination remains benign.  Patient advised to continue with elective cholecystectomy. If he notices worsening abdominal pain/discomfort, nausea/vomiting, or fevers/chills, advised to present to ED for emergent evaluation. He expresses understanding  Plan: Elective cholecystectomy with Dr Freida Busman F/u as needed

## 2020-05-17 NOTE — Telephone Encounter (Signed)
Informed pt FMLA has been completed and faxed, confirmation went through. Pt states he will come and pick up the original form.

## 2020-05-17 NOTE — Assessment & Plan Note (Signed)
Patient follows at the [facility] Department for his HIV and other routine screenings. He notes that he sends in iFOBT yearly through the health department - screening has been negative. He will inquire about COVID vaccination through the health department as well.

## 2020-05-18 ENCOUNTER — Encounter (INDEPENDENT_AMBULATORY_CARE_PROVIDER_SITE_OTHER): Payer: Self-pay

## 2020-05-18 NOTE — Progress Notes (Signed)
CMP with improved total bilirubin and downtrending transaminitis. Persistent elevation in alkaline phosphatase. Patient scheduled for cholecystectomy within one week.

## 2020-05-21 ENCOUNTER — Encounter: Payer: Self-pay | Admitting: Internal Medicine

## 2020-05-21 ENCOUNTER — Other Ambulatory Visit: Payer: Self-pay

## 2020-05-21 ENCOUNTER — Ambulatory Visit: Payer: Self-pay

## 2020-05-31 ENCOUNTER — Ambulatory Visit: Payer: Self-pay | Admitting: Surgery

## 2020-06-04 ENCOUNTER — Other Ambulatory Visit (HOSPITAL_COMMUNITY)
Admission: RE | Admit: 2020-06-04 | Discharge: 2020-06-04 | Disposition: A | Discharge status: Home / Self Care | Payer: HRSA Program | Source: Ambulatory Visit | Attending: Surgery | Admitting: Surgery

## 2020-06-04 DIAGNOSIS — Z20822 Contact with and (suspected) exposure to covid-19: Secondary | ICD-10-CM | POA: Insufficient documentation

## 2020-06-04 DIAGNOSIS — Z01812 Encounter for preprocedural laboratory examination: Secondary | ICD-10-CM | POA: Insufficient documentation

## 2020-06-04 LAB — SARS CORONAVIRUS 2 (TAT 6-24 HRS): SARS Coronavirus 2: NEGATIVE

## 2020-06-06 ENCOUNTER — Other Ambulatory Visit: Payer: Self-pay

## 2020-06-06 ENCOUNTER — Encounter (HOSPITAL_COMMUNITY): Payer: Self-pay | Admitting: Surgery

## 2020-06-06 NOTE — Progress Notes (Signed)
Patient denies shortness of breath, fever, cough or chest pain.  PCP - None  Urgent Care  Cardiologist - n/a Internal Med - Dr Leanna Sato [facility] Dept - Dr Danna Hefty  Chest x-ray - 05/04/20 (2V) EKG - 05/11/20 Stress Test - n/a ECHO - 05/11/20 Cardiac Cath - n/a  Sleep Apnea - Yes CPAP -  Uses cpap nightly  Anesthesia review: Yes  STOP now taking any Aspirin (unless otherwise instructed by your surgeon), Aleve, Naproxen, Ibuprofen, Motrin, Advil, Goody's, BC's, all herbal medications, fish oil, and all vitamins.   Coronavirus Screening Covid test on 06/04/20 was negative.  Patient verbalized understanding of instructions that were given via phone.

## 2020-06-06 NOTE — Progress Notes (Signed)
Anesthesia Chart Review: Same day workup  Recent admission 9/30-10/10/2019 for acute hypoxic respiratory failure secondary to Covid pneumonia. Patient treated with 5 days of Remdesivir and sent home with prescription to finish 10 days of decadron. He had a moderate case of Covid-19 requiring 2 liters of supplemental oxygen initially and quickly weaned off. Was symptomatic mostly with exertion. He was able to follow up in the COVID clinic and repeat CXR on 10/17 with almost complete resolution of the ground glass/airspace opacities. When last seen by PCP Dr. Mcarthur Rossetti on 05/17/20 he was noted to be feeling much better, mild dyspnea on exertion. Discussed upcoming cholecystectomy, pt was advised to proceed with this.  Hx of HIV followed by [facility] health department. He is on [employer].  Will need DOS labs and eval.   EKG 05/11/20: Normal sinus rhythm. Rate 69. Left axis deviation. Moderate voltage criteria for LVH, may be normal variant ( R in aVL , Cornell product ). No significant change since last tracing  CHEST - 2 VIEW 05/04/20:  COMPARISON:  04/19/2020 prior radiographs  FINDINGS: The cardiomediastinal silhouette is unremarkable.  Near complete resolution of bilateral ground-glass/airspace opacities noted with mild residual opacity in the RIGHT UPPER lobe.  There is no evidence of pulmonary edema, suspicious pulmonary nodule/mass, pleural effusion, or pneumothorax.  No acute bony abnormalities are identified.  IMPRESSION: Near complete resolution of bilateral ground-glass/airspace opacities.  Zannie Cove [facility] Short Stay Center/Anesthesiology Phone [phone] 06/06/2020 11:37 AM

## 2020-06-06 NOTE — Anesthesia Preprocedure Evaluation (Addendum)
Anesthesia Evaluation  Patient identified by MRN, date of birth, ID band Patient awake    Reviewed: Allergy & Precautions, H&P , NPO status , Patient's Chart, lab work & pertinent test results, reviewed documented beta blocker date and time   Airway Mallampati: I  TM Distance: >3 FB Neck ROM: full    Dental no notable dental hx. (+) Poor Dentition, Chipped, Missing, Loose,    Pulmonary sleep apnea , pneumonia,    Pulmonary exam normal breath sounds clear to auscultation       Cardiovascular Exercise Tolerance: Good negative cardio ROS   Rhythm:regular Rate:Normal     Neuro/Psych PSYCHIATRIC DISORDERS Depression  Neuromuscular disease    GI/Hepatic negative GI ROS, Neg liver ROS,   Endo/Other  negative endocrine ROS  Renal/GU negative Renal ROS  negative genitourinary   Musculoskeletal   Abdominal   Peds  Hematology negative hematology ROS (+)   Anesthesia Other Findings   Reproductive/Obstetrics negative OB ROS                            Anesthesia Physical Anesthesia Plan  ASA: III  Anesthesia Plan: General   Post-op Pain Management:    Induction: Intravenous  PONV Risk Score and Plan: 2 and Ondansetron, Dexamethasone and Treatment may vary due to age or medical condition  Airway Management Planned: Oral ETT  Additional Equipment:   Intra-op Plan:   Post-operative Plan: Extubation in OR  Informed Consent: I have reviewed the patients History and Physical, chart, labs and discussed the procedure including the risks, benefits and alternatives for the proposed anesthesia with the patient or authorized representative who has indicated his/her understanding and acceptance.     Dental Advisory Given  Plan Discussed with: CRNA and Anesthesiologist  Anesthesia Plan Comments: (PAT note by Antionette Poles, PA-C: Recent admission 9/30-10/10/2019 for acute hypoxic respiratory failure  secondary to Covid pneumonia. Patient treated with 5 days of Remdesivir and sent home with prescription to finish 10 days of decadron. He had a moderate case of Covid-19 requiring 2 liters of supplemental oxygen initially and quickly weaned off. Was symptomatic mostly with exertion. He was able to follow up in the COVID clinic and repeat CXR on 10/17 with almost complete resolution of the ground glass/airspace opacities. When last seen by PCP Dr. Mcarthur Rossetti on 05/17/20 he was noted to be feeling much better, mild dyspnea on exertion. Discussed upcoming cholecystectomy, pt was advised to proceed with this.  Hx of HIV followed by [facility] health department. He is on [employer].  Will need DOS labs and eval.   EKG 05/11/20: Normal sinus rhythm. Rate 69. Left axis deviation. Moderate voltage criteria for LVH, may be normal variant ( R in aVL , Cornell product ). No significant change since last tracing  CHEST - 2 VIEW 05/04/20:  COMPARISON:  04/19/2020 prior radiographs  FINDINGS: The cardiomediastinal silhouette is unremarkable.  Near complete resolution of bilateral ground-glass/airspace opacities noted with mild residual opacity in the RIGHT UPPER lobe.  There is no evidence of pulmonary edema, suspicious pulmonary nodule/mass, pleural effusion, or pneumothorax.  No acute bony abnormalities are identified.  IMPRESSION: Near complete resolution of bilateral ground-glass/airspace opacities.  )       Anesthesia Quick Evaluation

## 2020-06-07 ENCOUNTER — Encounter (HOSPITAL_COMMUNITY): Payer: Self-pay | Admitting: Surgery

## 2020-06-07 ENCOUNTER — Encounter (HOSPITAL_COMMUNITY)
Admission: RE | Disposition: A | Discharge status: Home / Self Care | Payer: Self-pay | Source: Home / Self Care | Attending: Surgery

## 2020-06-07 ENCOUNTER — Ambulatory Visit (HOSPITAL_COMMUNITY): Payer: Self-pay | Admitting: Physician Assistant

## 2020-06-07 ENCOUNTER — Ambulatory Visit (HOSPITAL_COMMUNITY): Payer: Self-pay

## 2020-06-07 ENCOUNTER — Ambulatory Visit (HOSPITAL_COMMUNITY)
Admission: RE | Admit: 2020-06-07 | Discharge: 2020-06-07 | Disposition: A | Discharge status: Home / Self Care | Payer: Self-pay | Attending: Surgery | Admitting: Surgery

## 2020-06-07 DIAGNOSIS — K851 Biliary acute pancreatitis without necrosis or infection: Secondary | ICD-10-CM | POA: Insufficient documentation

## 2020-06-07 DIAGNOSIS — Z888 Allergy status to other drugs, medicaments and biological substances status: Secondary | ICD-10-CM | POA: Insufficient documentation

## 2020-06-07 DIAGNOSIS — K8010 Calculus of gallbladder with chronic cholecystitis without obstruction: Secondary | ICD-10-CM | POA: Insufficient documentation

## 2020-06-07 DIAGNOSIS — Z8616 Personal history of COVID-19: Secondary | ICD-10-CM | POA: Insufficient documentation

## 2020-06-07 DIAGNOSIS — Z419 Encounter for procedure for purposes other than remedying health state, unspecified: Secondary | ICD-10-CM

## 2020-06-07 DIAGNOSIS — Z79899 Other long term (current) drug therapy: Secondary | ICD-10-CM | POA: Insufficient documentation

## 2020-06-07 DIAGNOSIS — K420 Umbilical hernia with obstruction, without gangrene: Secondary | ICD-10-CM | POA: Insufficient documentation

## 2020-06-07 HISTORY — PX: CHOLECYSTECTOMY: SHX55

## 2020-06-07 HISTORY — DX: Sleep apnea, unspecified: G47.30

## 2020-06-07 HISTORY — DX: Depression, unspecified: F32.A

## 2020-06-07 SURGERY — LAPAROSCOPIC CHOLECYSTECTOMY
Anesthesia: General

## 2020-06-07 MED ORDER — OXYCODONE HCL 5 MG PO TABS
ORAL_TABLET | ORAL | Status: AC
  Filled 2020-06-07: qty 1

## 2020-06-07 MED ORDER — CEFAZOLIN SODIUM-DEXTROSE 2-4 GM/100ML-% IV SOLN: 2.0000 g | INTRAVENOUS | Status: DC

## 2020-06-07 MED ORDER — OXYCODONE HCL 5 MG/5ML PO SOLN: 5.0000 mg | Freq: Once | ORAL | Status: AC | PRN

## 2020-06-07 MED ORDER — ONDANSETRON HCL 4 MG/2ML IJ SOLN
4.0000 mg | Freq: Once | INTRAMUSCULAR | Status: AC | PRN
  Administered 2020-06-07: 4 mg via INTRAVENOUS

## 2020-06-07 MED ORDER — ACETAMINOPHEN 160 MG/5ML PO SOLN: 325.0000 mg | ORAL | Status: DC | PRN

## 2020-06-07 MED ORDER — ONDANSETRON HCL 4 MG/2ML IJ SOLN
INTRAMUSCULAR | Status: DC | PRN
  Administered 2020-06-07: 4 mg via INTRAVENOUS

## 2020-06-07 MED ORDER — CEFAZOLIN SODIUM-DEXTROSE 2-4 GM/100ML-% IV SOLN
INTRAVENOUS | Status: AC
  Filled 2020-06-07: qty 100

## 2020-06-07 MED ORDER — BUPIVACAINE HCL (PF) 0.25 % IJ SOLN
INTRAMUSCULAR | Status: AC
  Filled 2020-06-07: qty 30

## 2020-06-07 MED ORDER — DEXAMETHASONE SODIUM PHOSPHATE 10 MG/ML IJ SOLN
INTRAMUSCULAR | Status: DC | PRN
  Administered 2020-06-07: 4 mg via INTRAVENOUS

## 2020-06-07 MED ORDER — NEOSTIGMINE METHYLSULFATE 3 MG/3ML IV SOSY
PREFILLED_SYRINGE | INTRAVENOUS | Status: DC | PRN
  Administered 2020-06-07: 3 mg via INTRAVENOUS

## 2020-06-07 MED ORDER — ACETAMINOPHEN 325 MG PO TABS: 325.0000 mg | ORAL_TABLET | ORAL | Status: DC | PRN

## 2020-06-07 MED ORDER — PROPOFOL 10 MG/ML IV BOLUS
INTRAVENOUS | Status: AC
  Filled 2020-06-07: qty 20

## 2020-06-07 MED ORDER — AMISULPRIDE (ANTIEMETIC) 5 MG/2ML IV SOLN
5.0000 mg | Freq: Once | INTRAVENOUS | Status: AC
  Administered 2020-06-07: 5 mg via INTRAVENOUS

## 2020-06-07 MED ORDER — CHLORHEXIDINE GLUCONATE 0.12 % MT SOLN
OROMUCOSAL | Status: AC
  Administered 2020-06-07: 15 mL via OROMUCOSAL
  Filled 2020-06-07: qty 15

## 2020-06-07 MED ORDER — MIDAZOLAM HCL 2 MG/2ML IJ SOLN
INTRAMUSCULAR | Status: AC
  Filled 2020-06-07: qty 2

## 2020-06-07 MED ORDER — OXYCODONE HCL 5 MG PO TABS: 5.0000 mg | ORAL_TABLET | Freq: Four times a day (QID) | ORAL | 0 refills | Status: DC | PRN

## 2020-06-07 MED ORDER — FENTANYL CITRATE (PF) 100 MCG/2ML IJ SOLN
INTRAMUSCULAR | Status: AC
  Filled 2020-06-07: qty 2

## 2020-06-07 MED ORDER — MIDAZOLAM HCL 5 MG/5ML IJ SOLN
INTRAMUSCULAR | Status: DC | PRN
  Administered 2020-06-07 (×2): 1 mg via INTRAVENOUS

## 2020-06-07 MED ORDER — LACTATED RINGERS IV SOLN: INTRAVENOUS | Status: DC

## 2020-06-07 MED ORDER — SODIUM CHLORIDE 0.9 % IR SOLN
Status: DC | PRN
  Administered 2020-06-07: 1000 mL

## 2020-06-07 MED ORDER — FENTANYL CITRATE (PF) 100 MCG/2ML IJ SOLN
INTRAMUSCULAR | Status: DC | PRN
  Administered 2020-06-07: 25 ug via INTRAVENOUS
  Administered 2020-06-07: 50 ug via INTRAVENOUS
  Administered 2020-06-07: 150 ug via INTRAVENOUS
  Administered 2020-06-07: 25 ug via INTRAVENOUS

## 2020-06-07 MED ORDER — MEPERIDINE HCL 25 MG/ML IJ SOLN: 6.2500 mg | INTRAMUSCULAR | Status: DC | PRN

## 2020-06-07 MED ORDER — DEXAMETHASONE SODIUM PHOSPHATE 10 MG/ML IJ SOLN
INTRAMUSCULAR | Status: AC
  Filled 2020-06-07: qty 1

## 2020-06-07 MED ORDER — BUPIVACAINE HCL (PF) 0.25 % IJ SOLN
INTRAMUSCULAR | Status: DC | PRN
  Administered 2020-06-07: 16 mL

## 2020-06-07 MED ORDER — FENTANYL CITRATE (PF) 250 MCG/5ML IJ SOLN
INTRAMUSCULAR | Status: AC
  Filled 2020-06-07: qty 5

## 2020-06-07 MED ORDER — FENTANYL CITRATE (PF) 100 MCG/2ML IJ SOLN
25.0000 ug | INTRAMUSCULAR | Status: DC | PRN
  Administered 2020-06-07 (×3): 50 ug via INTRAVENOUS

## 2020-06-07 MED ORDER — LIDOCAINE 2% (20 MG/ML) 5 ML SYRINGE
INTRAMUSCULAR | Status: AC
  Filled 2020-06-07: qty 5

## 2020-06-07 MED ORDER — CEFAZOLIN SODIUM-DEXTROSE 2-3 GM-%(50ML) IV SOLR
INTRAVENOUS | Status: DC | PRN
  Administered 2020-06-07: 2 g via INTRAVENOUS

## 2020-06-07 MED ORDER — IOHEXOL 300 MG/ML  SOLN
INTRAMUSCULAR | Status: DC | PRN
  Administered 2020-06-07: 13 mL

## 2020-06-07 MED ORDER — LIDOCAINE 2% (20 MG/ML) 5 ML SYRINGE
INTRAMUSCULAR | Status: DC | PRN
  Administered 2020-06-07: 60 mg via INTRAVENOUS
  Administered 2020-06-07: 40 mg via INTRAVENOUS

## 2020-06-07 MED ORDER — ONDANSETRON HCL 4 MG/2ML IJ SOLN
INTRAMUSCULAR | Status: AC
  Filled 2020-06-07: qty 2

## 2020-06-07 MED ORDER — OXYCODONE HCL 5 MG PO TABS
5.0000 mg | ORAL_TABLET | Freq: Once | ORAL | Status: AC | PRN
  Administered 2020-06-07: 5 mg via ORAL

## 2020-06-07 MED ORDER — ROCURONIUM BROMIDE 10 MG/ML (PF) SYRINGE
PREFILLED_SYRINGE | INTRAVENOUS | Status: DC | PRN
  Administered 2020-06-07: 60 mg via INTRAVENOUS

## 2020-06-07 MED ORDER — ORAL CARE MOUTH RINSE: 15.0000 mL | Freq: Once | OROMUCOSAL | Status: AC

## 2020-06-07 MED ORDER — MECLIZINE HCL 25 MG PO TABS: 25.0000 mg | ORAL_TABLET | Freq: Three times a day (TID) | ORAL | 0 refills | Status: DC | PRN

## 2020-06-07 MED ORDER — 0.9 % SODIUM CHLORIDE (POUR BTL) OPTIME
TOPICAL | Status: DC | PRN
  Administered 2020-06-07: 1000 mL

## 2020-06-07 MED ORDER — DEXMEDETOMIDINE (PRECEDEX) IN NS 20 MCG/5ML (4 MCG/ML) IV SYRINGE
PREFILLED_SYRINGE | INTRAVENOUS | Status: DC | PRN
  Administered 2020-06-07: 12 ug via INTRAVENOUS
  Administered 2020-06-07 (×2): 4 ug via INTRAVENOUS

## 2020-06-07 MED ORDER — PROPOFOL 10 MG/ML IV BOLUS
INTRAVENOUS | Status: DC | PRN
  Administered 2020-06-07: 200 mg via INTRAVENOUS

## 2020-06-07 MED ORDER — ROCURONIUM BROMIDE 10 MG/ML (PF) SYRINGE
PREFILLED_SYRINGE | INTRAVENOUS | Status: AC
  Filled 2020-06-07: qty 10

## 2020-06-07 MED ORDER — GLYCOPYRROLATE PF 0.2 MG/ML IJ SOSY
PREFILLED_SYRINGE | INTRAMUSCULAR | Status: DC | PRN
  Administered 2020-06-07: .4 mg via INTRAVENOUS

## 2020-06-07 MED ORDER — AMISULPRIDE (ANTIEMETIC) 5 MG/2ML IV SOLN
INTRAVENOUS | Status: AC
  Filled 2020-06-07: qty 2

## 2020-06-07 MED ORDER — CHLORHEXIDINE GLUCONATE 0.12 % MT SOLN: 15.0000 mL | Freq: Once | OROMUCOSAL | Status: AC

## 2020-06-07 SURGICAL SUPPLY — 50 items
ADH SKN CLS APL DERMABOND .7 (GAUZE/BANDAGES/DRESSINGS) ×1
APL PRP STRL LF DISP 70% ISPRP (MISCELLANEOUS) ×1
APPLIER CLIP 5 13 M/L LIGAMAX5 (MISCELLANEOUS) ×3
APR CLP MED LRG 5 ANG JAW (MISCELLANEOUS) ×1
BAG SPEC RTRVL LRG 6X4 10 (ENDOMECHANICALS) ×1
BLADE CLIPPER SURG (BLADE) IMPLANT
CANISTER SUCT 3000ML PPV (MISCELLANEOUS) ×3 IMPLANT
CHLORAPREP W/TINT 26 (MISCELLANEOUS) ×3 IMPLANT
CLIP APPLIE 5 13 M/L LIGAMAX5 (MISCELLANEOUS) ×1 IMPLANT
COVER SURGICAL LIGHT HANDLE (MISCELLANEOUS) ×3 IMPLANT
COVER WAND RF STERILE (DRAPES) ×3 IMPLANT
DERMABOND ADVANCED (GAUZE/BANDAGES/DRESSINGS) ×2
DERMABOND ADVANCED .7 DNX12 (GAUZE/BANDAGES/DRESSINGS) ×1 IMPLANT
ELECT REM PT RETURN 9FT ADLT (ELECTROSURGICAL) ×3
ELECTRODE REM PT RTRN 9FT ADLT (ELECTROSURGICAL) ×1 IMPLANT
ENDOLOOP SUT PDS II  0 18 (SUTURE) ×6
ENDOLOOP SUT PDS II 0 18 (SUTURE) IMPLANT
GLOVE BIOGEL PI IND STRL 6 (GLOVE) ×1 IMPLANT
GLOVE BIOGEL PI INDICATOR 6 (GLOVE) ×2
GLOVE BIOGEL PI MICRO 5.5 (GLOVE) ×2
GLOVE BIOGEL PI MICRO STRL 5.5 (GLOVE) ×1 IMPLANT
GOWN STRL REUS W/ TWL LRG LVL3 (GOWN DISPOSABLE) ×3 IMPLANT
GOWN STRL REUS W/TWL LRG LVL3 (GOWN DISPOSABLE) ×9
KIT BASIN OR (CUSTOM PROCEDURE TRAY) ×3 IMPLANT
KIT TURNOVER KIT B (KITS) ×3 IMPLANT
L-HOOK LAP DISP 36CM (ELECTROSURGICAL) ×3
LHOOK LAP DISP 36CM (ELECTROSURGICAL) ×1 IMPLANT
NEEDLE INSUFFLATION 14GA 120MM (NEEDLE) IMPLANT
NS IRRIG 1000ML POUR BTL (IV SOLUTION) ×3 IMPLANT
PAD ARMBOARD 7.5X6 YLW CONV (MISCELLANEOUS) ×3 IMPLANT
PENCIL BUTTON HOLSTER BLD 10FT (ELECTRODE) ×3 IMPLANT
POUCH SPECIMEN RETRIEVAL 10MM (ENDOMECHANICALS) ×3 IMPLANT
SCISSORS LAP 5X35 DISP (ENDOMECHANICALS) ×3 IMPLANT
SET CHOLANGIOGRAPH 5 50 .035 (SET/KITS/TRAYS/PACK) ×2 IMPLANT
SET IRRIG TUBING LAPAROSCOPIC (IRRIGATION / IRRIGATOR) ×3 IMPLANT
SET TUBE SMOKE EVAC HIGH FLOW (TUBING) ×3 IMPLANT
SLEEVE ENDOPATH XCEL 5M (ENDOMECHANICALS) ×6 IMPLANT
SPECIMEN JAR SMALL (MISCELLANEOUS) ×3 IMPLANT
SUT MNCRL AB 4-0 PS2 18 (SUTURE) ×5 IMPLANT
SUT NOVA NAB GS-21 0 18 T12 DT (SUTURE) ×3 IMPLANT
SUT VIC AB 3-0 SH 27 (SUTURE) ×3
SUT VIC AB 3-0 SH 27XBRD (SUTURE) IMPLANT
SUT VICRYL 0 UR6 27IN ABS (SUTURE) ×3 IMPLANT
TOWEL GREEN STERILE (TOWEL DISPOSABLE) ×3 IMPLANT
TOWEL GREEN STERILE FF (TOWEL DISPOSABLE) ×3 IMPLANT
TRAY LAPAROSCOPIC MC (CUSTOM PROCEDURE TRAY) ×3 IMPLANT
TROCAR XCEL 12X100 BLDLESS (ENDOMECHANICALS) IMPLANT
TROCAR XCEL BLUNT TIP 100MML (ENDOMECHANICALS) ×3 IMPLANT
TROCAR XCEL NON-BLD 5MMX100MML (ENDOMECHANICALS) ×3 IMPLANT
WATER STERILE IRR 1000ML POUR (IV SOLUTION) ×3 IMPLANT

## 2020-06-07 NOTE — Progress Notes (Signed)
Wasted 1 cc Fentanyl with witness.Jacobo Forest RN .

## 2020-06-07 NOTE — Anesthesia Postprocedure Evaluation (Signed)
Anesthesia Post Note  Patient: Mario Simmons  Procedure(s) Performed: LAPAROSCOPIC CHOLECYSTECTOMY WITH INTRAOPERATIVE CHOLANGIOGRAM AND UMBILICAL HERNIA REPAIR (N/A )     Patient location during evaluation: PACU Anesthesia Type: General Level of consciousness: awake and alert Pain management: pain level controlled Vital Signs Assessment: post-procedure vital signs reviewed and stable Respiratory status: spontaneous breathing, nonlabored ventilation, respiratory function stable and patient connected to nasal cannula oxygen Cardiovascular status: blood pressure returned to baseline and stable Postop Assessment: no apparent nausea or vomiting Anesthetic complications: no   No complications documented.  Last Vitals:  Vitals:   06/07/20 1340 06/07/20 1426  BP:  117/76  Pulse: 70 (!) 57  Resp: 10 14  Temp:    SpO2: 100% 96%    Last Pain:  Vitals:   06/07/20 1426  TempSrc:   PainSc: 10-Worst pain ever                 Reveca Desmarais

## 2020-06-07 NOTE — Discharge Instructions (Signed)
SURGERY DISCHARGE INSTRUCTIONS  Activity  No heavy lifting greater than 10 pounds for 6 weeks after surgery.  Ok to shower, but do not bathe or submerge incisions underwater.  Do not drive while taking narcotic pain medication.  Wound Care  Your incisiona are covered with skin glue called Dermabond. This will peel off on its own over time.  You may shower and allow warm soapy water to run over your incisions. Gently pat dry.  Do not submerge your incision underwater.  Monitor your incision for any new redness, tenderness, or drainage.  When to Call us:  Fever greater than 100.5  New redness, drainage, or swelling at incision site  Severe pain, nausea, or vomiting  Jaundice (yellowing of the whites of the eyes or skin)  Follow-up You have an appointment scheduled with Dr. Freida Busman on 06/26/20 at 10:45am. This will be at the [facility] Surgery office at [address], [place], [place]. Please arrive at least 15 minutes prior to your scheduled appointment time.  For questions or concerns, please call the office at [phone].

## 2020-06-07 NOTE — Transfer of Care (Signed)
Immediate Anesthesia Transfer of Care Note  Patient: Mario Simmons  Procedure(s) Performed: LAPAROSCOPIC CHOLECYSTECTOMY WITH INTRAOPERATIVE CHOLANGIOGRAM AND UMBILICAL HERNIA REPAIR (N/A )  Patient Location: PACU  Anesthesia Type:General  Level of Consciousness: drowsy and patient cooperative  Airway & Oxygen Therapy: Patient Spontanous Breathing and Patient connected to face mask oxygen  Post-op Assessment: Report given to RN and Post -op Vital signs reviewed and stable  Post vital signs: Reviewed and stable  Last Vitals:  Vitals Value Taken Time  BP    Temp    Pulse    Resp    SpO2      Last Pain:  Vitals:   06/07/20 0906  TempSrc:   PainSc: 0-No pain      Patients Stated Pain Goal: 5 (06/07/20 0907)  Complications: No complications documented.

## 2020-06-07 NOTE — Op Note (Signed)
Date: 06/07/20  Patient: Mario Simmons MRN: [id number]  Preoperative Diagnosis: Cholelithiasis, gallstone pancreatitis, umbilical hernia Postoperative Diagnosis: Same  Procedure:  1. Laparoscopic cholecystectomy 2. Intraoperative cholangiogram 3. Primary repair of umbilical hernia  Surgeon: Sophronia Simas, MD  EBL: Minimal  Anesthesia: General  Specimens: Gallbladder  Indications: Mario Simmons is a 54 yo male who recently had an episode of biliary pancreatitis with jaundice. His jaundice resolved spontaneously, and he was presumed to have passed a gallstone. Cholecystectomy with intraoperative cholangiogram was recommended. After an extensive discussion of the risks and benefits of surgery, he agreed to proceed.  Findings: Chronic cholecystitis. Normal cholangiogram. Umbilical hernia containing incarcerated omentum, with 1cm facial defect.  Procedure details: Informed consent was obtained in the preoperative area prior to the procedure. The patient was brought to the operating room and placed on the table in the supine position. General anesthesia was induced and appropriate lines and drains were placed for intraoperative monitoring. Perioperative antibiotics were administered per SCIP guidelines. The abdomen was prepped and draped in the usual sterile fashion. A pre-procedure timeout was taken verifying patient identity, surgical site and procedure to be performed.  An infraumbilical skin incision was made over the umbilical hernia and the subcutaneous tissue was divided with cautery. The hernia sac was identified and circumferentially dissected out. The overlying skin was then taken off the hernia sac. The sac was opened and contained a small amount of omentum, which could not be reduced. This was amputated with cautery. The defect was probed and the fascial edges were clear. A 47mm Hassan trocar was then placed through the hernia defect and the abdomen was insufflated. The peritoneal cavity  was inspected with no evidence of visceral or vascular injury. Three 71mm ports were placed in the right subcostal margin, all under direct visualization. The fundus of the gallbladder was grasped and retracted cephalad. Omentum was adherent to the gallbladder, and this was carefully taken down with cautery and blunt dissection. The infundibulum was then able to be identified and retracted laterally. The cystic triangle was dissected out using cautery and blunt dissection, and the critical view of safety was obtained. The cystic artery was clipped and ligated. A ductotomy was made on the cystic duct, and a cholangiocatheter was passed through the abdominal wall via a small stab incision. The cystic duct was cannulated and the catheter was secured in place with a clip. A cholangiogram was then performed with fluoroscopy. The entire common bile duct and common hepatic duct filled, and the bifurcation of the hepatic duct was visualized. There were no filling defects, the duct was normal in diameter, and contrast was observed filling the duodenum. The cholangiocatheter was removed and the cystic duct was divided a the site of the ductotomy. A PDS endoloop was used to close the cystic duct stump. The gallbladder was then taken off the liver using cautery. The specimen was placed in an endocatch bag and removed. The cystic duct and artery stumps were visualized with no signs of bleeding or bile leak. The abdomen was irrigated with saline until the effluent was clear. The ports were removed under direct visualization and the abdomen was desufflated.   The umbilical hernia was closed with interrupted 0 Novofil suture. The umbilical stalk was tacked to the fascia with a 3-0 Vicryl suture. The skin at all port sites was closed with 4-0 monocryl subcuticular suture. Dermabond was applied.  The patient tolerated the procedure well with no apparent complications. All counts were correct x2 at the end of  the procedure. The  patient was extubated and taken to PACU in stable condition.  Sophronia Simas, MD 06/07/20 1:37 PM

## 2020-06-07 NOTE — H&P (Signed)
Mario Simmons is an 54 y.o. male.   Chief Complaint: gallstone pancreatitis HPI: Mario Simmons is a 54 yo male who had an episode of gallstone pancreatitis in late October. He presented with severe abdominal pain and was found to have a bilirubin of 8 on labs done by his PCP. He then went to the ED, where a RUQ US showed gallstones. By then his bilirubin had decreased to 2. He was discharged home from the ED. His pancreatitis symptoms have resolved and he presents today for cholecystectomy. He says he occasionally still has abdominal discomfort but not as severe as when he had pancreatitis. He has been avoiding fatty foods. Of note, he was hospitalized with COVID in early October. He had a negative COVID test 3 days ago.  Past Medical History:  Diagnosis Date  . Allergy   . Carpal tunnel syndrome   . COVID 04/19/2020   hospital 9/30-10/4/21  . Depression   . Dry skin   . ED (erectile dysfunction)   . HIV (human immunodeficiency virus infection) (HCC)    mananged by Dr Danna Hefty at [facility] Dept per pt on 06/06/20  . Hypogonadism in male   . Sleep apnea    uses cpap nightly    Past Surgical History:  Procedure Laterality Date  . FRACTURE SURGERY Left 2010   middle and 4th finger  . WISDOM TOOTH EXTRACTION      Family History  Problem Relation Age of Onset  . Stroke Father   . Hypertension Father   . Healthy Mother   . Hyperlipidemia Mother   . Kidney disease Brother        1 Brother  . Diabetes Paternal Grandmother   . Healthy Child        1 Child  . Kidney disease Brother   . Prostate cancer Neg Hx    Social History:  reports that he has never smoked. He has never used smokeless tobacco. He reports previous drug use. Drug: Marijuana. He reports that he does not drink alcohol.  Allergies:  Allergies  Allergen Reactions  . Prednisone Other (See Comments)    "makes me have nightmares"    Medications Prior to Admission  Medication Sig Dispense Refill  . ascorbic  acid (VITAMIN C) 500 MG tablet Take 500 mg by mouth at bedtime.     Marland Kitchen BIKTARVY 50-200-25 MG TABS tablet Take 1 tablet by mouth at bedtime.     . Quercetin 250 MG TABS Take 250 mg by mouth at bedtime. With bromelain     . SUPER B COMPLEX/C PO Take 1 tablet by mouth at bedtime.     . vitamin B-12 (CYANOCOBALAMIN) 50 MCG tablet Take 50 mcg by mouth at bedtime.       No results found for this or any previous visit (from the past 48 hour(s)). No results found.  Review of Systems  Constitutional: Negative for chills and fever.  Respiratory: Negative for stridor.   Cardiovascular: Negative for chest pain.  Gastrointestinal: Positive for abdominal pain and nausea. Negative for vomiting.  Musculoskeletal: Negative for gait problem.  Skin: Negative for color change and pallor.  Neurological: Negative for seizures and speech difficulty.  Psychiatric/Behavioral: Negative for agitation and confusion.    Blood pressure (!) 141/90, pulse 67, temperature 98.2 F (36.8 C), temperature source Oral, resp. rate 18, height 5\' 9"  (1.753 m), weight 112 kg, SpO2 97 %. Physical Exam Constitutional:      General: He is not in acute distress.  Appearance: Normal appearance.  HENT:     Head: Normocephalic and atraumatic.  Eyes:     General: No scleral icterus.    Conjunctiva/sclera: Conjunctivae normal.  Pulmonary:     Effort: Pulmonary effort is normal. No respiratory distress.     Breath sounds: No stridor.  Abdominal:     General: There is no distension.     Palpations: Abdomen is soft.     Tenderness: There is no abdominal tenderness.     Comments: Umbilical hernia  Musculoskeletal:        General: Normal range of motion.     Cervical back: Normal range of motion.  Skin:    General: Skin is warm and dry.     Coloration: Skin is not jaundiced.  Neurological:     General: No focal deficit present.     Mental Status: He is alert and oriented to person, place, and time. Mental status is at  baseline.  Psychiatric:        Mood and Affect: Mood normal.        Behavior: Behavior normal.        Thought Content: Thought content normal.      Assessment/Plan 54 yo male with a history of HIV, COVID pneumonia, and recent gallstone pancreatitis. He presents today for surgery. Proceed to OR for laparoscopic cholecystectomy with intraoperative cholangiogram. Will close umbilical hernia as well. Risks and benefits discussed. Plan for discharge home from PACU.  Fritzi Mandes, MD 06/07/2020, 9:24 AM

## 2020-06-07 NOTE — Anesthesia Procedure Notes (Addendum)
Procedure Name: Intubation Date/Time: 06/07/2020 11:04 AM Performed by: Demetrio Lapping, CRNA Pre-anesthesia Checklist: Patient identified, Emergency Drugs available, Suction available and Patient being monitored Patient Re-evaluated:Patient Re-evaluated prior to induction Oxygen Delivery Method: Circle System Utilized Preoxygenation: Pre-oxygenation with 100% oxygen Induction Type: IV induction Ventilation: Two handed mask ventilation required and Oral airway inserted - appropriate to patient size Laryngoscope Size: Hyacinth Meeker and 2 Grade View: Grade I Tube type: Oral Tube size: 7.5 mm Number of attempts: 1 Airway Equipment and Method: Stylet Placement Confirmation: ETT inserted through vocal cords under direct vision,  positive ETCO2 and breath sounds checked- equal and bilateral Secured at: 23 (at lip) cm Tube secured with: Tape Dental Injury: Teeth and Oropharynx as per pre-operative assessment  Comments: Performed by Prescott Gum, SRNA

## 2020-06-08 ENCOUNTER — Encounter (HOSPITAL_COMMUNITY): Payer: Self-pay | Admitting: Surgery

## 2020-06-08 LAB — SURGICAL PATHOLOGY

## 2020-06-11 ENCOUNTER — Encounter (HOSPITAL_COMMUNITY): Payer: Self-pay | Admitting: Surgery

## 2020-06-11 ENCOUNTER — Encounter: Payer: Self-pay | Admitting: *Deleted

## 2020-06-18 NOTE — Telephone Encounter (Signed)
Called pt, he does want a refill of pain med, referred him to surgeon for refill. Made appt for f/u for 11/30 dr Karilyn Cota

## 2020-06-19 ENCOUNTER — Encounter: Payer: Self-pay | Admitting: Internal Medicine

## 2020-06-19 ENCOUNTER — Ambulatory Visit (INDEPENDENT_AMBULATORY_CARE_PROVIDER_SITE_OTHER): Payer: Self-pay | Admitting: Internal Medicine

## 2020-06-19 DIAGNOSIS — K8000 Calculus of gallbladder with acute cholecystitis without obstruction: Secondary | ICD-10-CM

## 2020-06-19 NOTE — Patient Instructions (Signed)
Take ibuprofen up to 3200 mg a day. Max 800 mg every 6 hours for severe pain.

## 2020-06-20 NOTE — Progress Notes (Signed)
   CC: surgical follow up  HPI:  [name] is a 54 y.o. with a PMHx listed below presenting for follow up after a recent cholecystectomy. For details of today's visit and the status of his chronic medical issues please refer to the assessment and plan.   Past Medical History:  Diagnosis Date  . Allergy   . Carpal tunnel syndrome   . COVID 04/19/2020   hospital 9/30-10/4/21  . Depression   . Dry skin   . ED (erectile dysfunction)   . HIV (human immunodeficiency virus infection) (HCC)    mananged by Dr Danna Hefty at [facility] Dept per pt on 06/06/20  . Hypogonadism in male   . Sleep apnea    uses cpap nightly   Review of Systems:    Constitutional: Denies fever chills Chest: No difficulty breathing, shortness of breath Abdomen: No nausea vomiting diarrhea or constipation Skin: No rash, bleeding MSK:No swelling  Physical Exam:  Vitals:   06/19/20 1601  BP: (!) 130/94  Pulse: 68  Temp: 98.2 F (36.8 C)  TempSrc: Oral  SpO2: 97%  Weight: 243 lb 6.4 oz (110.4 kg)  Height: 5\' 9"  (1.753 m)   General: No acute distress, appears stated age CV: Regular rate and rhythm no murmurs Lungs: Clear to auscultation bilaterally, no rales or rhonchi Abdomen: Tenderness to palpation along surgical sites, nondistended, bowel sounds present Skin: Surgical scars on the abdomen appear clean and dry MSK: No lower extremity edema or tenderness  Assessment & Plan:   See Encounters Tab for problem based charting.  Patient discussed with Dr. .

## 2020-06-20 NOTE — Assessment & Plan Note (Signed)
Patient presents for evaluation of pain after recent cholecystectomy.  States that he is overall doing well and tolerating pain levels with use of ibuprofen since he has been out of his opiate prescriptions.  Discussed continuing to use ibuprofen as needed for severe pain.  Patient expressed understanding.  He is planning on following up with surgery on 12/7.  Denies any nausea, vomiting.  He is having bowel movements.  His abdominal pain is continuing to improve daily.  Pain is mostly along the surgical sites.  Plan: -Continue ibuprofen for pain as needed -Patient is to follow-up with surgery on 12/7

## 2020-06-22 NOTE — Progress Notes (Signed)
Internal Medicine Clinic Attending ° °Case discussed with Dr. Rehman  At the time of the visit.  We reviewed the resident’s history and exam and pertinent patient test results.  I agree with the assessment, diagnosis, and plan of care documented in the resident’s note.  ° °

## 2021-01-22 ENCOUNTER — Encounter: Payer: Self-pay | Admitting: *Deleted

## 2021-02-22 ENCOUNTER — Other Ambulatory Visit (HOSPITAL_COMMUNITY): Payer: Self-pay

## 2021-04-05 ENCOUNTER — Other Ambulatory Visit: Payer: Self-pay

## 2021-04-05 ENCOUNTER — Encounter: Payer: Self-pay | Admitting: Family

## 2021-04-05 ENCOUNTER — Ambulatory Visit: Payer: Self-pay

## 2021-04-05 DIAGNOSIS — Z113 Encounter for screening for infections with a predominantly sexual mode of transmission: Secondary | ICD-10-CM

## 2021-04-05 DIAGNOSIS — Z79899 Other long term (current) drug therapy: Secondary | ICD-10-CM

## 2021-04-05 DIAGNOSIS — B20 Human immunodeficiency virus [HIV] disease: Secondary | ICD-10-CM

## 2021-04-11 LAB — COMPLETE METABOLIC PANEL WITH GFR
AG Ratio: 1.8 (calc) (ref 1.0–2.5)
ALT: 12 U/L (ref 9–46)
AST: 19 U/L (ref 10–35)
Albumin: 4.9 g/dL (ref 3.6–5.1)
Alkaline phosphatase (APISO): 76 U/L (ref 35–144)
BUN: 16 mg/dL (ref 7–25)
CO2: 29 mmol/L (ref 20–32)
Calcium: 10 mg/dL (ref 8.6–10.3)
Chloride: 105 mmol/L (ref 98–110)
Creat: 1.17 mg/dL (ref 0.70–1.30)
Globulin: 2.8 g/dL (calc) (ref 1.9–3.7)
Glucose, Bld: 101 mg/dL — ABNORMAL HIGH (ref 65–99)
Potassium: 4.3 mmol/L (ref 3.5–5.3)
Sodium: 140 mmol/L (ref 135–146)
Total Bilirubin: 1.6 mg/dL — ABNORMAL HIGH (ref 0.2–1.2)
Total Protein: 7.7 g/dL (ref 6.1–8.1)
eGFR: 74 mL/min/{1.73_m2} (ref >=60)

## 2021-04-11 LAB — CBC WITH DIFFERENTIAL/PLATELET
Absolute Monocytes: 542 cells/uL (ref 200–950)
Basophils Absolute: 19 cells/uL (ref 0–200)
Basophils Relative: 0.3 %
Eosinophils Absolute: 63 cells/uL (ref 15–500)
Eosinophils Relative: 1 %
HCT: 44 % (ref 38.5–50.0)
Hemoglobin: 14.2 g/dL (ref 13.2–17.1)
Lymphs Abs: 1632 cells/uL (ref 850–3900)
MCH: 28.1 pg (ref 27.0–33.0)
MCHC: 32.3 g/dL (ref 32.0–36.0)
MCV: 87 fL (ref 80.0–100.0)
MPV: 9.6 fL (ref 7.5–12.5)
Monocytes Relative: 8.6 %
Neutro Abs: 4045 cells/uL (ref 1500–7800)
Neutrophils Relative %: 64.2 %
Platelets: 258 10*3/uL (ref 140–400)
RBC: 5.06 10*6/uL (ref 4.20–5.80)
RDW: 12.3 % (ref 11.0–15.0)
Total Lymphocyte: 25.9 %
WBC: 6.3 10*3/uL (ref 3.8–10.8)

## 2021-04-11 LAB — HIV-1/2 AB - DIFFERENTIATION
HIV-1 antibody: POSITIVE — AB
HIV-2 Ab: NEGATIVE

## 2021-04-11 LAB — HIV-1 RNA ULTRAQUANT REFLEX TO GENTYP+
HIV 1 RNA Quant: 58 copies/mL — ABNORMAL HIGH
HIV-1 RNA Quant, Log: 1.76 Log copies/mL — ABNORMAL HIGH

## 2021-04-11 LAB — HLA B*5701: HLA-B*5701 w/rflx HLA-B High: NEGATIVE

## 2021-04-11 LAB — T-HELPER CELLS (CD4) COUNT (NOT AT ARMC)
Absolute CD4: 475 cells/uL — ABNORMAL LOW (ref 490–1740)
CD4 T Helper %: 29 % — ABNORMAL LOW (ref 30–61)
Total lymphocyte count: 1649 cells/uL (ref 850–3900)

## 2021-04-11 LAB — HEPATITIS A ANTIBODY, TOTAL: Hepatitis A AB,Total: REACTIVE — AB

## 2021-04-11 LAB — QUANTIFERON-TB GOLD PLUS
Mitogen-NIL: >10 IU/mL
NIL: 0.05 IU/mL
QuantiFERON-TB Gold Plus: NEGATIVE
TB1-NIL: 0.02 IU/mL
TB2-NIL: <0 IU/mL

## 2021-04-11 LAB — HEPATITIS C ANTIBODY
Hepatitis C Ab: NONREACTIVE
SIGNAL TO CUT-OFF: 0.03 (ref <1.00)

## 2021-04-11 LAB — HEPATITIS B CORE ANTIBODY, TOTAL: Hep B Core Total Ab: NONREACTIVE

## 2021-04-11 LAB — LIPID PANEL
Cholesterol: 177 mg/dL (ref <200)
HDL: 55 mg/dL (ref >=40)
LDL Cholesterol (Calc): 109 mg/dL (calc) — ABNORMAL HIGH
Non-HDL Cholesterol (Calc): 122 mg/dL (calc) (ref <130)
Total CHOL/HDL Ratio: 3.2 (calc) (ref <5.0)
Triglycerides: 44 mg/dL (ref <150)

## 2021-04-11 LAB — RPR: RPR Ser Ql: NONREACTIVE

## 2021-04-11 LAB — HEPATITIS B SURFACE ANTIGEN: Hepatitis B Surface Ag: NONREACTIVE

## 2021-04-11 LAB — HEPATITIS B SURFACE ANTIBODY,QUALITATIVE: Hep B S Ab: REACTIVE — AB

## 2021-04-11 LAB — HIV ANTIBODY (ROUTINE TESTING W REFLEX): HIV 1&2 Ab, 4th Generation: REACTIVE — AB

## 2021-04-22 ENCOUNTER — Other Ambulatory Visit: Payer: Self-pay

## 2021-04-22 ENCOUNTER — Encounter: Payer: Self-pay | Admitting: Family

## 2021-04-22 ENCOUNTER — Ambulatory Visit (INDEPENDENT_AMBULATORY_CARE_PROVIDER_SITE_OTHER): Payer: Self-pay | Admitting: Family

## 2021-04-22 VITALS — BP 117/76 | HR 62 | Temp 98.2°F | Wt 245.0 lb

## 2021-04-22 DIAGNOSIS — R739 Hyperglycemia, unspecified: Secondary | ICD-10-CM

## 2021-04-22 DIAGNOSIS — Z0000 Encounter for general adult medical examination without abnormal findings: Secondary | ICD-10-CM

## 2021-04-22 DIAGNOSIS — Z23 Encounter for immunization: Secondary | ICD-10-CM

## 2021-04-22 DIAGNOSIS — Z21 Asymptomatic human immunodeficiency virus [HIV] infection status: Secondary | ICD-10-CM

## 2021-04-22 MED ORDER — BIKTARVY 50-200-25 MG PO TABS: 1.0000 | ORAL_TABLET | Freq: Every day | ORAL | 5 refills | Status: DC

## 2021-04-22 NOTE — Assessment & Plan Note (Signed)
   Discussed importance of safe sexual practice and condom usage.  Declines condoms.  Due for routine dental care and will refer to [facility] dental clinic if needed.   Vaccines reviewed

## 2021-04-22 NOTE — Assessment & Plan Note (Signed)
Mario Simmons has concern for prediabetes with elevated blood sugars recently.  No current excessive hunger, thirst, or urination.  Check hemoglobin A1c.  Discussed reducing carbohydrates and sugary/processed foods.  Additional follow-up and medication per primary care as indicated.

## 2021-04-22 NOTE — Progress Notes (Signed)
Brief Narrative   Patient ID: Mario Simmons, male    DOB: [birth year], 55 y.o.   MRN: [id number]  Mario Simmons is a 55 y/o AA male diagnosed with HIV in 2019 with risk factor of heterosexual contact. Initial CD4, viral load and genotype unavailable. [id number] negative. History of Mycobacterium Avium Complex infection s/p treatment. Sole medication regimen of Biktarvy since initial diagnosis.   Subjective:    Chief Complaint  Patient presents with   HIV Positive/AIDS     HPI:  Mario Simmons is a 55 y.o. male with previous medical history of HIV disease, hypogonadism, Mycobacterium Avium infection, and Covid presenting today to establish care for HIV disease.  Mario Simmons previous provider's notes reviewed with last CD4 count was 517 with undetectable viral load on 09/21/20. Immunizations updated. Previous history of MAC infection noted. Initial diagnosis was 2019 with risk factor of heterosexual contact. Has been on Biktarvy since initial diagnosis. Initially experienced changes in bowel habits when starting medication that have since improved. Works as a Geophysicist/field seismologist and is currently staying in a hotel and has stable access to food.  Mario Simmons has been approved. Denies feelings of being down, depressed or hopeless recently. No current recreational or illicit drug use, tobacco use or alcohol consumption. Overall feeling well today with concern for elevated blood sugars. Denies excessive hunger, thirst or urination. Denies fevers, chills, night sweats, headaches, changes in vision, neck pain/stiffness, nausea, diarrhea, vomiting, lesions or rashes.Condoms offered and declined. Due for routine dental care.   Mario Simmons initial clinic lab work with viral load undetectable and CD4 count of 475. [id number] and Quantiferon Gold negative. Immune to Hepatitis B and Hepatitis A. No infection with Hepatitis C. RPR non-reactive. Kidney function, liver function and electrolytes within normal ranges.  Blood sugar mildly elevated at 101. Previous blood sugars reviewed.   Allergies  Allergen Reactions   Prednisone Other (See Comments)    "makes me have nightmares"      Outpatient Medications Prior to Visit  Medication Sig Dispense Refill   ascorbic acid (VITAMIN C) 500 MG tablet Take 500 mg by mouth at bedtime.      meclizine (ANTIVERT) 25 MG tablet Take 1 tablet (25 mg total) by mouth 3 (three) times daily as needed for dizziness. 30 tablet 0   Quercetin 250 MG TABS Take 250 mg by mouth at bedtime. With bromelain      SUPER B COMPLEX/C PO Take 1 tablet by mouth at bedtime.      vitamin B-12 (CYANOCOBALAMIN) 50 MCG tablet Take 50 mcg by mouth at bedtime.      BIKTARVY 50-200-25 MG TABS tablet Take 1 tablet by mouth at bedtime.      dexamethasone (DECADRON) 6 MG tablet TAKE 1 TABLET (6 MG TOTAL) BY MOUTH DAILY FOR 5 DAYS. 5 tablet 0   oxyCODONE (OXY IR/ROXICODONE) 5 MG immediate release tablet Take 1 tablet (5 mg total) by mouth every 6 (six) hours as needed for severe pain. 15 tablet 0   No facility-administered medications prior to visit.     Past Medical History:  Diagnosis Date   Allergy    Carpal tunnel syndrome    COVID 04/19/2020   hospital 9/30-10/4/21   Depression    Dry skin    ED (erectile dysfunction)    HIV (human immunodeficiency virus infection) ([place])    mananged by Dr Drue Stager at [facility] per pt on 06/06/20   Hypogonadism in male    Sleep apnea  uses cpap nightly     Past Surgical History:  Procedure Laterality Date   CHOLECYSTECTOMY N/A 06/07/2020   Procedure: LAPAROSCOPIC CHOLECYSTECTOMY WITH INTRAOPERATIVE CHOLANGIOGRAM AND UMBILICAL HERNIA REPAIR;  Surgeon: Dwan Bolt, MD;  Location: [facility];  Service: General;  Laterality: N/A;   FRACTURE SURGERY Left 2010   middle and 4th finger   HERNIA REPAIR     WISDOM TOOTH EXTRACTION        Review of Systems  Constitutional:  Negative for appetite change, chills, fatigue, fever and  unexpected weight change.  Eyes:  Negative for visual disturbance.  Respiratory:  Negative for cough, chest tightness, shortness of breath and wheezing.   Cardiovascular:  Negative for chest pain and leg swelling.  Gastrointestinal:  Negative for abdominal pain, constipation, diarrhea, nausea and vomiting.  Genitourinary:  Negative for dysuria, flank pain, frequency, genital sores, hematuria and urgency.  Skin:  Negative for rash.  Allergic/Immunologic: Negative for immunocompromised state.  Neurological:  Negative for dizziness and headaches.     Objective:    BP 117/76   Pulse 62   Temp 98.2 F (36.8 C) (Oral)   Wt 245 lb (111.1 kg)   BMI 36.18 kg/m  Nursing note and vital signs reviewed.  Physical Exam Constitutional:      General: He is not in acute distress.    Appearance: He is well-developed.  Eyes:     Conjunctiva/sclera: Conjunctivae normal.  Cardiovascular:     Rate and Rhythm: Normal rate and regular rhythm.     Heart sounds: Normal heart sounds. No murmur heard.   No friction rub. No gallop.  Pulmonary:     Effort: Pulmonary effort is normal. No respiratory distress.     Breath sounds: Normal breath sounds. No wheezing or rales.  Chest:     Chest wall: No tenderness.  Abdominal:     General: Bowel sounds are normal.     Palpations: Abdomen is soft.     Tenderness: There is no abdominal tenderness.  Musculoskeletal:     Cervical back: Neck supple.  Lymphadenopathy:     Cervical: No cervical adenopathy.  Skin:    General: Skin is warm and dry.     Findings: No rash.  Neurological:     Mental Status: He is alert and oriented to person, place, and time.  Psychiatric:        Behavior: Behavior normal.        Thought Content: Thought content normal.        Judgment: Judgment normal.     Depression screen [facility] 2/9 04/22/2021 06/19/2020 05/15/2020 05/11/2020 05/04/2020  Decreased Interest 0 0 0 0 0  Down, Depressed, Hopeless 0 3 0 0 0  PHQ - 2 Score 0 3 0  0 0  Altered sleeping - 0 0 0 -  Tired, decreased energy - 3 0 0 -  Change in appetite - 0 0 0 -  Feeling bad or failure about yourself  - 0 0 0 -  Trouble concentrating - 0 0 0 -  Moving slowly or fidgety/restless - 0 0 0 -  Suicidal thoughts - 0 0 0 -  PHQ-9 Score - 6 0 0 -  Difficult doing work/chores - Not difficult at all Not difficult at all Not difficult at all -       Assessment & Plan:    Patient Active Problem List   Diagnosis Date Noted   Elevated blood sugar 04/22/2021   Healthcare maintenance 05/17/2020  Acute cholecystitis due to biliary calculus 05/11/2020   HIV (human immunodeficiency virus infection) ([place]) 04/20/2020   Pneumonia due to COVID-19 virus 04/19/2020   Hypogonadism in male 07/31/2015   Erectile dysfunction of organic origin 07/31/2015   Fatigue 05/13/2011   Decreased libido 05/13/2011   Pain, joint, ankle, left 05/13/2011   Ankle pain, right 05/13/2011   CARPAL TUNNEL SYNDROME, RIGHT 01/11/2010   DRY SKIN 12/10/2009   ALLERGIC RHINITIS 12/06/2009     Problem List Items Addressed This Visit       Other   HIV (human immunodeficiency virus infection) ([place]) - Primary    Mario Simmons continues to have well-controlled virus with good adherence and tolerance to his ART regimen of Biktarvy.  Initially diagnosed in 2019 with risk factor of heterosexual contact.  Previous history of Mycobacterium avium complex infection status posttreatment.  Reviewed lab work and discussed plan of care.  Continue current dose of Biktarvy.  Plan for follow-up in 3 months or sooner if needed with lab work 1 to 2 weeks prior to appointment.      Relevant Medications   bictegravir-emtricitabine-tenofovir AF (BIKTARVY) 50-200-25 MG TABS tablet   Healthcare maintenance    Discussed importance of safe sexual practice and condom usage.  Declines condoms. Due for routine dental care and will refer to [place] clinic if needed.  Vaccines reviewed      Elevated blood sugar     Mario Simmons has concern for prediabetes with elevated blood sugars recently.  No current excessive hunger, thirst, or urination.  Check hemoglobin A1c.  Discussed reducing carbohydrates and sugary/processed foods.  Additional follow-up and medication per primary care as indicated.      Relevant Orders   HgB A1c     I have discontinued Mario Simmons's oxyCODONE and dexamethasone. I have also changed his Biktarvy. Additionally, I am having him maintain his ascorbic acid, vitamin B-12, SUPER B COMPLEX/C PO, Quercetin, and meclizine.   Meds ordered this encounter  Medications   bictegravir-emtricitabine-tenofovir AF (BIKTARVY) 50-200-25 MG TABS tablet    Sig: Take 1 tablet by mouth at bedtime.    Dispense:  30 tablet    Refill:  5    Order Specific Question:   Supervising Provider    Answer:   Carlyle Basques [[id number]]     Follow-up: Return in about 3 months (around 07/23/2021), or if symptoms worsen or fail to improve.   Terri Piedra, MSN, FNP-C Nurse Practitioner [facility] [place] number: [phone]

## 2021-04-22 NOTE — Assessment & Plan Note (Signed)
Mr. Dettmann continues to have well-controlled virus with good adherence and tolerance to his ART regimen of Biktarvy.  Initially diagnosed in 2019 with risk factor of heterosexual contact.  Previous history of Mycobacterium avium complex infection status posttreatment.  Reviewed lab work and discussed plan of care.  Continue current dose of Biktarvy.  Plan for follow-up in 3 months or sooner if needed with lab work 1 to 2 weeks prior to appointment.

## 2021-04-22 NOTE — Patient Instructions (Addendum)
Nice to see you.  Continue to take your medication daily.  Refills will be sent to the pharmacy.  Plan for follow up in 3 months or sooner if needed with lab work 1-2 weeks prior to appointment.   Flonase is available over the counter.  Have a great day and stay safe!

## 2021-04-23 LAB — HEMOGLOBIN A1C
Hgb A1c MFr Bld: 5.6 % of total Hgb (ref <5.7)
Mean Plasma Glucose: 114 mg/dL
eAG (mmol/L): 6.3 mmol/L

## 2021-05-15 MED ORDER — FLUTICASONE PROPIONATE 50 MCG/ACT NA SUSP: 1.0000 | Freq: Every day | NASAL | 1 refills | Status: AC

## 2021-07-24 ENCOUNTER — Other Ambulatory Visit: Payer: Self-pay

## 2021-07-24 ENCOUNTER — Ambulatory Visit (INDEPENDENT_AMBULATORY_CARE_PROVIDER_SITE_OTHER): Payer: Self-pay

## 2021-07-24 ENCOUNTER — Encounter: Payer: Self-pay | Admitting: Family

## 2021-07-24 ENCOUNTER — Ambulatory Visit: Payer: Self-pay | Admitting: Family

## 2021-07-24 VITALS — BP 132/84 | HR 76 | Resp 16 | Ht 69.0 in | Wt 241.0 lb

## 2021-07-24 DIAGNOSIS — Z0000 Encounter for general adult medical examination without abnormal findings: Secondary | ICD-10-CM

## 2021-07-24 DIAGNOSIS — Z113 Encounter for screening for infections with a predominantly sexual mode of transmission: Secondary | ICD-10-CM

## 2021-07-24 DIAGNOSIS — Z23 Encounter for immunization: Secondary | ICD-10-CM

## 2021-07-24 DIAGNOSIS — Z21 Asymptomatic human immunodeficiency virus [HIV] infection status: Secondary | ICD-10-CM

## 2021-07-24 MED ORDER — BIKTARVY 50-200-25 MG PO TABS: 1.0000 | ORAL_TABLET | Freq: Every day | ORAL | 5 refills | Status: DC

## 2021-07-24 NOTE — Patient Instructions (Addendum)
Nice to see you.  Check out [email or web address]  Continue to take your medication daily.  Refills at the pharmacy.  Renew financial assistance.   Plan for follow up in 4 months or sooner if needed.   Have a great day and stay safe!

## 2021-07-24 NOTE — Progress Notes (Signed)
° °  Covid-19 Vaccination Clinic  Name:  Mario Simmons    MRN: [id number] DOB: 04/24/1966  07/24/2021  Mario Simmons was observed post Covid-19 immunization for 15 minutes without incident. He was provided with Vaccine Information Sheet and instruction to access the V-Safe system.   Mario Simmons was instructed to call 911 with any severe reactions post vaccine: Difficulty breathing  Swelling of face and throat  A fast heartbeat  A bad rash all over body  Dizziness and weakness     Jake Goodson cma

## 2021-07-24 NOTE — Progress Notes (Signed)
Brief Narrative   Patient ID: Mario Simmons, male    DOB: 27-Oct-1965, 56 y.o.   MRN: [id number]  Mario Simmons is a 56 y/o AA male diagnosed with HIV in 2019 with risk factor of heterosexual contact. Initial CD4, viral load and genotype unavailable. [id number] negative. History of Mycobacterium Avium Complex infection s/p treatment. Sole medication regimen of Biktarvy since initial diagnosis.   Subjective:    Chief Complaint  Patient presents with   Follow-up    B20 condoms given by cma.    HPI:  Mario Simmons is a 56 y.o. male with HIV disease last seen on 04/22/2021 with well-controlled virus and good adherence and tolerance to his ART regimen of Biktarvy.  Viral load was undetectable with CD4 count of 475.  Found to be immune to hepatitis B and hepatitis A.  Here today for routine follow-up.  Mario Simmons continues to take his Biktarvy daily as prescribed with no adverse side effects.  Overall feeling well today with concern for continued weight management as he has been working on losing weight. Denies fevers, chills, night sweats, headaches, changes in vision, neck pain/stiffness, nausea, diarrhea, vomiting, lesions or rashes.  Mario Simmons has no problems obtaining medication from the pharmacy remains covered by [employer].  Denies feelings of being down, depressed, or hopeless recently.  No current recreational illicit drug use, tobacco use, or alcohol consumption.  Condoms offered.  Healthcare maintenance due includes COVID vaccination.   Allergies  Allergen Reactions   Prednisone Other (See Comments)    "makes me have nightmares"      Outpatient Medications Prior to Visit  Medication Sig Dispense Refill   ascorbic acid (VITAMIN C) 500 MG tablet Take 500 mg by mouth at bedtime.      fluticasone (FLONASE) 50 MCG/ACT nasal spray Place 1 spray into both nostrils daily. 16 g 1   meclizine (ANTIVERT) 25 MG tablet Take 1 tablet (25 mg total) by mouth 3 (three) times daily as needed for  dizziness. 30 tablet 0   SUPER B COMPLEX/C PO Take 1 tablet by mouth at bedtime.      vitamin B-12 (CYANOCOBALAMIN) 50 MCG tablet Take 50 mcg by mouth at bedtime.      bictegravir-emtricitabine-tenofovir AF (BIKTARVY) 50-200-25 MG TABS tablet Take 1 tablet by mouth at bedtime. 30 tablet 5   Quercetin 250 MG TABS Take 250 mg by mouth at bedtime. With bromelain      No facility-administered medications prior to visit.     Past Medical History:  Diagnosis Date   Allergy    Carpal tunnel syndrome    COVID 04/19/2020   hospital 9/30-10/4/21   Depression    Dry skin    ED (erectile dysfunction)    HIV (human immunodeficiency virus infection) ([place])    mananged by Dr Drue Stager at [place] per pt on 06/06/20   Hypogonadism in male    Sleep apnea    uses cpap nightly     Past Surgical History:  Procedure Laterality Date   CHOLECYSTECTOMY N/A 06/07/2020   Procedure: LAPAROSCOPIC CHOLECYSTECTOMY WITH INTRAOPERATIVE CHOLANGIOGRAM AND UMBILICAL HERNIA REPAIR;  Surgeon: Dwan Bolt, MD;  Location: [facility];  Service: General;  Laterality: N/A;   FRACTURE SURGERY Left 2010   middle and 4th finger   HERNIA REPAIR     WISDOM TOOTH EXTRACTION        Review of Systems  Constitutional:  Negative for appetite change, chills, fatigue, fever and unexpected weight change.  Eyes:  Negative for visual disturbance.  Respiratory:  Negative for cough, chest tightness, shortness of breath and wheezing.   Cardiovascular:  Negative for chest pain and leg swelling.  Gastrointestinal:  Negative for abdominal pain, constipation, diarrhea, nausea and vomiting.  Genitourinary:  Negative for dysuria, flank pain, frequency, genital sores, hematuria and urgency.  Skin:  Negative for rash.  Allergic/Immunologic: Negative for immunocompromised state.  Neurological:  Negative for dizziness and headaches.     Objective:    BP 132/84    Pulse 76    Resp 16    Ht _0  (1.753 m)    Wt 241 lb (109.3  kg) Comment: Weigh 238 at home   SpO2 97%    BMI 35.59 kg/m  Nursing note and vital signs reviewed.  Physical Exam Constitutional:      General: He is not in acute distress.    Appearance: He is well-developed.  Eyes:     Conjunctiva/sclera: Conjunctivae normal.  Cardiovascular:     Rate and Rhythm: Normal rate and regular rhythm.     Heart sounds: Normal heart sounds. No murmur heard.   No friction rub. No gallop.  Pulmonary:     Effort: Pulmonary effort is normal. No respiratory distress.     Breath sounds: Normal breath sounds. No wheezing or rales.  Chest:     Chest wall: No tenderness.  Abdominal:     General: Bowel sounds are normal.     Palpations: Abdomen is soft.     Tenderness: There is no abdominal tenderness.  Musculoskeletal:     Cervical back: Neck supple.  Lymphadenopathy:     Cervical: No cervical adenopathy.  Skin:    General: Skin is warm and dry.     Findings: No rash.  Neurological:     Mental Status: He is alert and oriented to person, place, and time.  Psychiatric:        Behavior: Behavior normal.        Thought Content: Thought content normal.        Judgment: Judgment normal.     Depression screen [facility] 2/9 07/24/2021 04/22/2021 06/19/2020 05/15/2020 05/11/2020  Decreased Interest 0 0 0 0 0  Down, Depressed, Hopeless 0 0 3 0 0  PHQ - 2 Score 0 0 3 0 0  Altered sleeping - - 0 0 0  Tired, decreased energy - - 3 0 0  Change in appetite - - 0 0 0  Feeling bad or failure about yourself  - - 0 0 0  Trouble concentrating - - 0 0 0  Moving slowly or fidgety/restless - - 0 0 0  Suicidal thoughts - - 0 0 0  PHQ-9 Score - - 6 0 0  Difficult doing work/chores - - Not difficult at all Not difficult at all Not difficult at all       Assessment & Plan:    Patient Active Problem List   Diagnosis Date Noted   Elevated blood sugar 04/22/2021   Healthcare maintenance 05/17/2020   Acute cholecystitis due to biliary calculus 05/11/2020   HIV (human  immunodeficiency virus infection) ([place]) 04/20/2020   Pneumonia due to COVID-19 virus 04/19/2020   Hypogonadism in male 07/31/2015   Erectile dysfunction of organic origin 07/31/2015   Fatigue 05/13/2011   Decreased libido 05/13/2011   Pain, joint, ankle, left 05/13/2011   Ankle pain, right 05/13/2011   CARPAL TUNNEL SYNDROME, RIGHT 01/11/2010   DRY SKIN 12/10/2009   ALLERGIC RHINITIS 12/06/2009  Problem List Items Addressed This Visit       Other   HIV (human immunodeficiency virus infection) ([place]) - Primary    Mario Simmons continues have well-controlled virus with good adherence and tolerance to his ART regimen of Biktarvy.  No signs/symptoms of opportunistic infection.  We reviewed previous lab work and discussed plan of care.  Discussed potential for medication changes with increased weight with Biktegravir/Tenofovir.  Wishes to maintain [place] for the meantime we will work on nutritional intake and lifestyle management.  Consider change to Dovato.  Check blood work today.  Renew financial assistance.  Plan for follow-up in 4 months or sooner if needed with lab work on the same day.      Relevant Medications   bictegravir-emtricitabine-tenofovir AF (BIKTARVY) 50-200-25 MG TABS tablet   Other Relevant Orders   T-helper cell (CD4)- ([facility] clinic only)   HIV-1 RNA quant-no reflex-bld   Healthcare maintenance    Discussed importance of safe sexual practice and condom use.  Condoms offered. Pfizer vaccine updated       Other Visit Diagnoses     Screening for STDs (sexually transmitted diseases)       Relevant Orders   RPR        I have discontinued Mario Simmons's Quercetin. I am also having him maintain his ascorbic acid, vitamin B-12, SUPER B COMPLEX/C PO, meclizine, fluticasone, and Biktarvy.   Meds ordered this encounter  Medications   bictegravir-emtricitabine-tenofovir AF (BIKTARVY) 50-200-25 MG TABS tablet    Sig: Take 1 tablet by mouth at bedtime.     Dispense:  30 tablet    Refill:  5    Order Specific Question:   Supervising Provider    Answer:   Carlyle Basques [[id number]]     Follow-up: Return in about 4 months (around 11/21/2021), or if symptoms worsen or fail to improve.   Terri Piedra, MSN, FNP-C Nurse Practitioner [facility] for Infectious Disease [place] number: [phone]

## 2021-07-24 NOTE — Assessment & Plan Note (Signed)
Mr. Hyacinth Meeker continues have well-controlled virus with good adherence and tolerance to his ART regimen of Biktarvy.  No signs/symptoms of opportunistic infection.  We reviewed previous lab work and discussed plan of care.  Discussed potential for medication changes with increased weight with Biktegravir/Tenofovir.  Wishes to maintain Biktarvy for the meantime we will work on nutritional intake and lifestyle management.  Consider change to Dovato.  Check blood work today.  Renew financial assistance.  Plan for follow-up in 4 months or sooner if needed with lab work on the same day.

## 2021-07-24 NOTE — Assessment & Plan Note (Signed)
·   Discussed importance of safe sexual practice and condom use.  Condoms offered.  Pfizer vaccine updated

## 2021-07-25 LAB — T-HELPER CELL (CD4) - (RCID CLINIC ONLY)
CD4 % Helper T Cell: 24 % — ABNORMAL LOW (ref 33–65)
CD4 T Cell Abs: 568 /uL (ref 400–1790)

## 2021-07-26 LAB — HIV-1 RNA QUANT-NO REFLEX-BLD
HIV 1 RNA Quant: 31 Copies/mL — ABNORMAL HIGH
HIV-1 RNA Quant, Log: 1.48 Log cps/mL — ABNORMAL HIGH

## 2021-07-26 LAB — RPR: RPR Ser Ql: NONREACTIVE

## 2021-12-04 ENCOUNTER — Ambulatory Visit: Payer: Self-pay | Admitting: Family

## 2021-12-04 ENCOUNTER — Other Ambulatory Visit: Payer: Self-pay

## 2021-12-04 ENCOUNTER — Encounter: Payer: Self-pay | Admitting: Family

## 2021-12-04 VITALS — BP 120/84 | HR 71 | Resp 16 | Ht 69.0 in | Wt 248.0 lb

## 2021-12-04 DIAGNOSIS — Z0000 Encounter for general adult medical examination without abnormal findings: Secondary | ICD-10-CM

## 2021-12-04 DIAGNOSIS — Z21 Asymptomatic human immunodeficiency virus [HIV] infection status: Secondary | ICD-10-CM

## 2021-12-04 DIAGNOSIS — E669 Obesity, unspecified: Secondary | ICD-10-CM

## 2021-12-04 MED ORDER — BIKTARVY 50-200-25 MG PO TABS: 1.0000 | ORAL_TABLET | Freq: Every day | ORAL | 5 refills | Status: DC

## 2021-12-04 NOTE — Patient Instructions (Addendum)
Nice to see you.  We will check your lab work today.  Continue to take your medication daily as prescribed.  Refills have been sent to the pharmacy.  Plan for follow up in 4 months or sooner if needed with lab work on the same day.  Have a great day and stay safe!  

## 2021-12-04 NOTE — Progress Notes (Signed)
Brief Narrative   Patient ID: Mario Simmons, male    DOB: 1966/02/09, 56 y.o.   MRN: [id number]  Mario Simmons is a 56 y/o AA male diagnosed with HIV in 2019 with risk factor of heterosexual contact. Initial CD4, viral load and genotype unavailable. [id number] negative. History of Mycobacterium Avium Complex infection s/p treatment. Sole medication regimen of Biktarvy since initial diagnosis.   Subjective:    Chief Complaint  Patient presents with   Follow-up    HPI:  Mario Simmons is a 56 y.o. male with HIV disease last seen on 07/24/2021 with well-controlled virus and good adherence and tolerance to [employer].  Viral load was undetectable and CD4 count 568.  Here today for routine follow-up.  Mario Simmons continues to take Mario Simmons daily as prescribed with no adverse side effects.  Feeling well today with concern for inability to lose weight.  Would like additional information on a research study for weight loss. Denies fevers, chills, night sweats, headaches, changes in vision, neck pain/stiffness, nausea, diarrhea, vomiting, lesions or rashes.  Mario Simmons has no problems obtaining medication from the pharmacy and remains covered by [employer].  Denies feelings of being down, depressed, or hopeless recently.  No current recreational or illicit drug use, tobacco use, or alcohol consumption.  Condoms offered.  Continues to work full-time and delivery.   Allergies  Allergen Reactions   Prednisone Other (See Comments)    "makes me have nightmares"      Outpatient Medications Prior to Visit  Medication Sig Dispense Refill   ascorbic acid (VITAMIN C) 500 MG tablet Take 500 mg by mouth at bedtime.      fluticasone (FLONASE) 50 MCG/ACT nasal spray Place 1 spray into both nostrils daily. 16 g 1   meclizine (ANTIVERT) 25 MG tablet Take 1 tablet (25 mg total) by mouth 3 (three) times daily as needed for dizziness. 30 tablet 0   SUPER B COMPLEX/C PO Take 1 tablet by mouth at bedtime.      vitamin  B-12 (CYANOCOBALAMIN) 50 MCG tablet Take 50 mcg by mouth at bedtime.      bictegravir-emtricitabine-tenofovir AF (BIKTARVY) 50-200-25 MG TABS tablet Take 1 tablet by mouth at bedtime. 30 tablet 5   No facility-administered medications prior to visit.     Past Medical History:  Diagnosis Date   Allergy    Carpal tunnel syndrome    COVID 04/19/2020   hospital 9/30-10/4/21   Depression    Dry skin    ED (erectile dysfunction)    HIV (human immunodeficiency virus infection) ([place])    mananged by Dr Drue Stager at [place] per pt on 06/06/20   Hypogonadism in male    Sleep apnea    uses cpap nightly     Past Surgical History:  Procedure Laterality Date   CHOLECYSTECTOMY N/A 06/07/2020   Procedure: LAPAROSCOPIC CHOLECYSTECTOMY WITH INTRAOPERATIVE CHOLANGIOGRAM AND UMBILICAL HERNIA REPAIR;  Surgeon: Dwan Bolt, MD;  Location: [place];  Service: General;  Laterality: N/A;   FRACTURE SURGERY Left 2010   middle and 4th finger   HERNIA REPAIR     WISDOM TOOTH EXTRACTION        Review of Systems  Constitutional:  Negative for appetite change, chills, fatigue, fever and unexpected weight change.  Eyes:  Negative for visual disturbance.  Respiratory:  Negative for cough, chest tightness, shortness of breath and wheezing.   Cardiovascular:  Negative for chest pain and leg swelling.  Gastrointestinal:  Negative for abdominal pain,  constipation, diarrhea, nausea and vomiting.  Genitourinary:  Negative for dysuria, flank pain, frequency, genital sores, hematuria and urgency.  Skin:  Negative for rash.  Allergic/Immunologic: Negative for immunocompromised state.  Neurological:  Negative for dizziness and headaches.     Objective:    BP 120/84   Pulse 71   Resp 16   Ht _0  (1.753 m)   Wt 248 lb (112.5 kg) Comment: shoes  SpO2 98%   BMI 36.62 kg/m  Nursing note and vital signs reviewed.   Physical Exam Constitutional:      General: He is not in acute distress.     Appearance: He is well-developed.  Eyes:     Conjunctiva/sclera: Conjunctivae normal.  Cardiovascular:     Rate and Rhythm: Normal rate and regular rhythm.     Heart sounds: Normal heart sounds. No murmur heard.   No friction rub. No gallop.  Pulmonary:     Effort: Pulmonary effort is normal. No respiratory distress.     Breath sounds: Normal breath sounds. No wheezing or rales.  Chest:     Chest wall: No tenderness.  Abdominal:     General: Bowel sounds are normal.     Palpations: Abdomen is soft.     Tenderness: There is no abdominal tenderness.  Musculoskeletal:     Cervical back: Neck supple.  Lymphadenopathy:     Cervical: No cervical adenopathy.  Skin:    General: Skin is warm and dry.     Findings: No rash.  Neurological:     Mental Status: He is alert and oriented to person, place, and time.  Psychiatric:        Behavior: Behavior normal.        Thought Content: Thought content normal.        Judgment: Judgment normal.        12/04/2021    3:56 PM 07/24/2021    4:11 PM 04/22/2021    3:17 PM 06/19/2020    3:56 PM 05/15/2020    3:58 PM  Depression screen PHQ 2/9  Decreased Interest 0 0 0 0 0  Down, Depressed, Hopeless 0 0 0 3 0  PHQ - 2 Score 0 0 0 3 0  Altered sleeping    0 0  Tired, decreased energy    3 0  Change in appetite    0 0  Feeling bad or failure about yourself     0 0  Trouble concentrating    0 0  Moving slowly or fidgety/restless    0 0  Suicidal thoughts    0 0  PHQ-9 Score    6 0  Difficult doing work/chores    Not difficult at all Not difficult at all       Assessment & Plan:    Patient Active Problem List   Diagnosis Date Noted   Obesity (BMI 30-39.9) 12/05/2021   Elevated blood sugar 04/22/2021   Healthcare maintenance 05/17/2020   Acute cholecystitis due to biliary calculus 05/11/2020   HIV (human immunodeficiency virus infection) ([place]) 04/20/2020   Pneumonia due to COVID-19 virus 04/19/2020   Hypogonadism in male 07/31/2015    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  We reviewed lab work and discussed plan of care.  Check blood work today.  Continue current dose of Biktarvy.  Plan for follow-up in 4 months or sooner if needed with lab work on the same day.       Relevant Medications   bictegravir-emtricitabine-tenofovir AF (BIKTARVY) 50-200-25 MG TABS tablet   Other Relevant Orders   Comprehensive metabolic panel (Completed)   T-helper cell (CD4)- ([facility] clinic only)   HIV-1 RNA quant-no reflex-bld   Healthcare maintenance    Discussed importance of safe sexual practices and condom use. Declines vaccinations.      Obesity (BMI 30-39.9)    Mario Simmons has a most recent BMI of 36.62.  He has attempted to lose weight in the past through lifestyle management.  We will connect him with [facility] Research Team to determine if he is eligible for the DO IT study.  May consider weight loss medications if he is not a candidate.  Continue with lifestyle management.         I am having Harrel Carina maintain his ascorbic acid, vitamin B-12, SUPER B COMPLEX/C PO, meclizine, fluticasone, and Biktarvy.   Meds ordered this encounter  Medications   bictegravir-emtricitabine-tenofovir AF (BIKTARVY) 50-200-25 MG TABS tablet    Sig: Take 1 tablet by mouth at bedtime.    Dispense:  30 tablet    Refill:  5    Order Specific Question:   Supervising Provider    Answer:   Carlyle Basques [[id number]]     Follow-up: Return in about 4 months (around 04/06/2022), or if symptoms worsen or  fail to improve.   Terri Piedra, MSN, FNP-C Nurse Practitioner [facility] for Infectious Disease [place] number: [phone]

## 2021-12-05 DIAGNOSIS — E669 Obesity, unspecified: Secondary | ICD-10-CM | POA: Insufficient documentation

## 2021-12-05 NOTE — Assessment & Plan Note (Signed)
Mario Simmons continues to have well-controlled virus with good adherence and tolerance to [employer].  We reviewed lab work and discussed plan of care.  Check blood work today.  Continue current dose of Biktarvy.  Plan for follow-up in 4 months or sooner if needed with lab work on the same day.

## 2021-12-05 NOTE — Assessment & Plan Note (Signed)
   Discussed importance of safe sexual practices and condom use.  Declines vaccinations.

## 2021-12-05 NOTE — Assessment & Plan Note (Signed)
Mr. Moorman has a most recent BMI of 36.62.  He has attempted to lose weight in the past through lifestyle management.  We will connect him with [facility] Research Team to determine if he is eligible for the DO IT study.  May consider weight loss medications if he is not a candidate.  Continue with lifestyle management.

## 2021-12-06 LAB — T-HELPER CELL (CD4) - (RCID CLINIC ONLY)
CD4 % Helper T Cell: 29 % — ABNORMAL LOW (ref 33–65)
CD4 T Cell Abs: 541 /uL (ref 400–1790)

## 2021-12-11 ENCOUNTER — Encounter (INDEPENDENT_AMBULATORY_CARE_PROVIDER_SITE_OTHER): Payer: Self-pay

## 2021-12-11 ENCOUNTER — Other Ambulatory Visit: Payer: Self-pay

## 2021-12-11 VITALS — BP 122/61 | HR 65 | Temp 97.8°F | Resp 16 | Ht 69.29 in | Wt 243.9 lb

## 2021-12-11 DIAGNOSIS — Z006 Encounter for examination for normal comparison and control in clinical research program: Secondary | ICD-10-CM

## 2021-12-11 NOTE — Research (Signed)
Mario Simmons seen for screening visit for [phone]. Medical History obtained and initial blood work collected. Anticipate enrollment if eligible within 2 weeks. Reports doing well, no concerns verbalized today.

## 2021-12-13 LAB — CBC WITH DIFFERENTIAL/PLATELET
Absolute Monocytes: 461 cells/uL (ref 200–950)
Basophils Absolute: 19 cells/uL (ref 0–200)
Basophils Relative: 0.3 %
Eosinophils Absolute: 288 cells/uL (ref 15–500)
Eosinophils Relative: 4.5 %
HCT: 43.3 % (ref 38.5–50.0)
Hemoglobin: 14.2 g/dL (ref 13.2–17.1)
Lymphs Abs: 2010 cells/uL (ref 850–3900)
MCH: 28.2 pg (ref 27.0–33.0)
MCHC: 32.8 g/dL (ref 32.0–36.0)
MCV: 85.9 fL (ref 80.0–100.0)
MPV: 9.8 fL (ref 7.5–12.5)
Monocytes Relative: 7.2 %
Neutro Abs: 3622 cells/uL (ref 1500–7800)
Neutrophils Relative %: 56.6 %
Platelets: 232 10*3/uL (ref 140–400)
RBC: 5.04 10*6/uL (ref 4.20–5.80)
RDW: 12.2 % (ref 11.0–15.0)
Total Lymphocyte: 31.4 %
WBC: 6.4 10*3/uL (ref 3.8–10.8)

## 2021-12-13 LAB — COMPREHENSIVE METABOLIC PANEL
AG Ratio: 1.5 (calc) (ref 1.0–2.5)
ALT: 15 U/L (ref 9–46)
AST: 19 U/L (ref 10–35)
Albumin: 4.6 g/dL (ref 3.6–5.1)
Alkaline phosphatase (APISO): 75 U/L (ref 35–144)
BUN: 14 mg/dL (ref 7–25)
CO2: 27 mmol/L (ref 20–32)
Calcium: 9.5 mg/dL (ref 8.6–10.3)
Chloride: 105 mmol/L (ref 98–110)
Creat: 1.2 mg/dL (ref 0.70–1.30)
Globulin: 3 g/dL (calc) (ref 1.9–3.7)
Glucose, Bld: 105 mg/dL — ABNORMAL HIGH (ref 65–99)
Potassium: 4.3 mmol/L (ref 3.5–5.3)
Sodium: 140 mmol/L (ref 135–146)
Total Bilirubin: 0.9 mg/dL (ref 0.2–1.2)
Total Protein: 7.6 g/dL (ref 6.1–8.1)

## 2021-12-13 LAB — BILIRUBIN, DIRECT: Bilirubin, Direct: 0.2 mg/dL (ref 0.0–0.2)

## 2021-12-13 LAB — HIV-1 RNA QUANT-NO REFLEX-BLD
HIV 1 RNA Quant: 27 copies/mL — ABNORMAL HIGH
HIV-1 RNA Quant, Log: 1.43 Log copies/mL — ABNORMAL HIGH

## 2021-12-13 LAB — PHOSPHORUS: Phosphorus: 3.3 mg/dL (ref 2.5–4.5)

## 2021-12-18 LAB — COMPREHENSIVE METABOLIC PANEL
AG Ratio: 1.5 (calc) (ref 1.0–2.5)
ALT: 12 U/L (ref 9–46)
AST: 14 U/L (ref 10–35)
Albumin: 4.3 g/dL (ref 3.6–5.1)
Alkaline phosphatase (APISO): 70 U/L (ref 35–144)
BUN: 18 mg/dL (ref 7–25)
CO2: 27 mmol/L (ref 20–32)
Calcium: 9.4 mg/dL (ref 8.6–10.3)
Chloride: 107 mmol/L (ref 98–110)
Creat: 1.1 mg/dL (ref 0.70–1.30)
Globulin: 2.9 g/dL (calc) (ref 1.9–3.7)
Glucose, Bld: 90 mg/dL (ref 65–99)
Potassium: 4.3 mmol/L (ref 3.5–5.3)
Sodium: 141 mmol/L (ref 135–146)
Total Bilirubin: 0.6 mg/dL (ref 0.2–1.2)
Total Protein: 7.2 g/dL (ref 6.1–8.1)

## 2021-12-18 LAB — HIV-1 RNA QUANT-NO REFLEX-BLD

## 2022-01-10 ENCOUNTER — Encounter (INDEPENDENT_AMBULATORY_CARE_PROVIDER_SITE_OTHER): Payer: Self-pay

## 2022-01-10 ENCOUNTER — Other Ambulatory Visit: Payer: Self-pay

## 2022-01-10 VITALS — BP 117/79 | HR 70 | Temp 98.0°F | Resp 16 | Wt 242.7 lb

## 2022-01-10 DIAGNOSIS — Z006 Encounter for examination for normal comparison and control in clinical research program: Secondary | ICD-10-CM

## 2022-01-11 LAB — BILIRUBIN, DIRECT: Bilirubin, Direct: 0.2 mg/dL (ref 0.0–0.2)

## 2022-01-11 LAB — COMPREHENSIVE METABOLIC PANEL
AG Ratio: 1.5 (calc) (ref 1.0–2.5)
ALT: 12 U/L (ref 9–46)
AST: 16 U/L (ref 10–35)
Albumin: 4.3 g/dL (ref 3.6–5.1)
Alkaline phosphatase (APISO): 76 U/L (ref 35–144)
BUN: 17 mg/dL (ref 7–25)
CO2: 27 mmol/L (ref 20–32)
Calcium: 9.4 mg/dL (ref 8.6–10.3)
Chloride: 105 mmol/L (ref 98–110)
Creat: 1.09 mg/dL (ref 0.70–1.30)
Globulin: 2.8 g/dL (calc) (ref 1.9–3.7)
Glucose, Bld: 96 mg/dL (ref 65–99)
Potassium: 4.3 mmol/L (ref 3.5–5.3)
Sodium: 139 mmol/L (ref 135–146)
Total Bilirubin: 1.2 mg/dL (ref 0.2–1.2)
Total Protein: 7.1 g/dL (ref 6.1–8.1)

## 2022-01-11 LAB — URINALYSIS
Bilirubin Urine: NEGATIVE
Glucose, UA: NEGATIVE
Hgb urine dipstick: NEGATIVE
Ketones, ur: NEGATIVE
Nitrite: NEGATIVE
Protein, ur: NEGATIVE
Specific Gravity, Urine: 1.016 (ref 1.001–1.035)
pH: 6 (ref 5.0–8.0)

## 2022-01-11 LAB — LIPID PANEL
Cholesterol: 164 mg/dL (ref <200)
HDL: 51 mg/dL (ref >=40)
LDL Cholesterol (Calc): 99 mg/dL (calc)
Non-HDL Cholesterol (Calc): 113 mg/dL (calc) (ref <130)
Total CHOL/HDL Ratio: 3.2 (calc) (ref <5.0)
Triglycerides: 53 mg/dL (ref <150)

## 2022-01-11 LAB — PHOSPHORUS: Phosphorus: 3 mg/dL (ref 2.5–4.5)

## 2022-01-31 ENCOUNTER — Other Ambulatory Visit: Payer: Self-pay

## 2022-02-07 ENCOUNTER — Encounter (INDEPENDENT_AMBULATORY_CARE_PROVIDER_SITE_OTHER): Payer: Self-pay | Admitting: *Deleted

## 2022-02-07 ENCOUNTER — Other Ambulatory Visit: Payer: Self-pay

## 2022-02-07 VITALS — BP 130/87 | HR 59 | Temp 97.6°F | Wt 251.0 lb

## 2022-02-07 DIAGNOSIS — Z006 Encounter for examination for normal comparison and control in clinical research program: Secondary | ICD-10-CM

## 2022-02-07 LAB — COMPREHENSIVE METABOLIC PANEL
AG Ratio: 1.5 (calc) (ref 1.0–2.5)
ALT: 10 U/L (ref 9–46)
AST: 12 U/L (ref 10–35)
Albumin: 4.2 g/dL (ref 3.6–5.1)
Alkaline phosphatase (APISO): 78 U/L (ref 35–144)
BUN: 18 mg/dL (ref 7–25)
CO2: 28 mmol/L (ref 20–32)
Calcium: 9.4 mg/dL (ref 8.6–10.3)
Chloride: 103 mmol/L (ref 98–110)
Creat: 1.17 mg/dL (ref 0.70–1.30)
Globulin: 2.8 g/dL (calc) (ref 1.9–3.7)
Glucose, Bld: 94 mg/dL (ref 65–99)
Potassium: 4.4 mmol/L (ref 3.5–5.3)
Sodium: 137 mmol/L (ref 135–146)
Total Bilirubin: 1.1 mg/dL (ref 0.2–1.2)
Total Protein: 7 g/dL (ref 6.1–8.1)

## 2022-02-07 LAB — BILIRUBIN, DIRECT: Bilirubin, Direct: 0.2 mg/dL (ref 0.0–0.2)

## 2022-02-07 LAB — PHOSPHORUS: Phosphorus: 3.5 mg/dL (ref 2.5–4.5)

## 2022-02-07 NOTE — Research (Signed)
Mario Simmons is here for his week 4 visit for [id number]. He denies any new problems or medications. He will return in 8 weeks.

## 2022-04-03 ENCOUNTER — Encounter (INDEPENDENT_AMBULATORY_CARE_PROVIDER_SITE_OTHER): Payer: Self-pay

## 2022-04-03 ENCOUNTER — Other Ambulatory Visit: Payer: Self-pay

## 2022-04-03 ENCOUNTER — Encounter: Payer: Self-pay | Admitting: Family

## 2022-04-03 ENCOUNTER — Ambulatory Visit (INDEPENDENT_AMBULATORY_CARE_PROVIDER_SITE_OTHER): Payer: Self-pay | Admitting: Family

## 2022-04-03 VITALS — BP 130/80 | HR 62 | Temp 97.7°F | Ht 69.5 in | Wt 252.0 lb

## 2022-04-03 VITALS — BP 120/78 | HR 66 | Temp 97.9°F | Resp 16 | Wt 250.4 lb

## 2022-04-03 DIAGNOSIS — Z006 Encounter for examination for normal comparison and control in clinical research program: Secondary | ICD-10-CM

## 2022-04-03 DIAGNOSIS — Z21 Asymptomatic human immunodeficiency virus [HIV] infection status: Secondary | ICD-10-CM

## 2022-04-03 DIAGNOSIS — Z0000 Encounter for general adult medical examination without abnormal findings: Secondary | ICD-10-CM

## 2022-04-03 DIAGNOSIS — B20 Human immunodeficiency virus [HIV] disease: Secondary | ICD-10-CM

## 2022-04-03 MED ORDER — BIKTARVY 50-200-25 MG PO TABS: 1.0000 | ORAL_TABLET | Freq: Every day | ORAL | 5 refills | Status: DC

## 2022-04-03 NOTE — Assessment & Plan Note (Signed)
Mario Simmons continues to have well-controlled virus with good adherence and tolerance to [employer].  Reviewed previous lab work and discussed plan of care.  Lab work drawn through [employer] television/film/video study.  Continue current dose of Biktarvy.  Financial assistance renewed.  Plan for follow-up in 6 months or sooner if needed with lab work through research.

## 2022-04-03 NOTE — Progress Notes (Signed)
Brief Narrative   Patient ID: Estefan Pattison, male    DOB: Jul 11, 1966, 56 y.o.   MRN: [id number]  Mr. Gluth is a 56 y/o AA male diagnosed with HIV in 2019 with risk factor of heterosexual contact. Initial CD4, viral load and genotype unavailable. [id number] negative. History of Mycobacterium Avium Complex infection s/p treatment. Sole medication regimen of Biktarvy since initial diagnosis.   Subjective:    Chief Complaint  Patient presents with   Follow-up    HPI:  Kapono Luhn is a 56 y.o. male with HIV disease last seen on 12/04/2021 with well-controlled virus and good adherence and tolerance to [employer].  Viral load was undetectable with CD4 count 541.  Kidney function, liver function, electrolytes within normal ranges.  Enrolled in the Do It Study. Here today for routine follow up.   Mr. Iyengar has been doing well since his last office visit with no new concerns/complaints.  Continues to take Biktarvy daily as prescribed with no adverse side effects and no problems obtaining medication from the pharmacy.  He remains enrolled in the DO IT study where he is currently receiving Biktarvy. Denies fevers, chills, night sweats, headaches, changes in vision, neck pain/stiffness, nausea, diarrhea, vomiting, lesions or rashes. Condoms offered.  Routine vaccinations up-to-date per recommendations.  Has been working a lot of overtime recently.   Allergies  Allergen Reactions   Prednisone Other (See Comments)    "makes me have nightmares"      Outpatient Medications Prior to Visit  Medication Sig Dispense Refill   ascorbic acid (VITAMIN C) 500 MG tablet Take 500 mg by mouth at bedtime.      fluticasone (FLONASE) 50 MCG/ACT nasal spray Place 1 spray into both nostrils daily. 16 g 1   meclizine (ANTIVERT) 25 MG tablet Take 1 tablet (25 mg total) by mouth 3 (three) times daily as needed for dizziness. 30 tablet 0   SUPER B COMPLEX/C PO Take 1 tablet by mouth at bedtime.      vitamin B-12  (CYANOCOBALAMIN) 50 MCG tablet Take 50 mcg by mouth at bedtime.      bictegravir-emtricitabine-tenofovir AF (BIKTARVY) 50-200-25 MG TABS tablet Take 1 tablet by mouth at bedtime. 30 tablet 5   No facility-administered medications prior to visit.     Past Medical History:  Diagnosis Date   Allergy    Carpal tunnel syndrome    COVID 04/19/2020   hospital 9/30-10/4/21   Depression    Dry skin    ED (erectile dysfunction)    HIV (human immunodeficiency virus infection) ([place])    mananged by Dr Drue Stager at [place] per pt on 06/06/20   Hypogonadism in male    Sleep apnea    uses cpap nightly     Past Surgical History:  Procedure Laterality Date   CHOLECYSTECTOMY N/A 06/07/2020   Procedure: LAPAROSCOPIC CHOLECYSTECTOMY WITH INTRAOPERATIVE CHOLANGIOGRAM AND UMBILICAL HERNIA REPAIR;  Surgeon: Dwan Bolt, MD;  Location: [facility];  Service: General;  Laterality: N/A;   FRACTURE SURGERY Left 2010   middle and 4th finger   HERNIA REPAIR     WISDOM TOOTH EXTRACTION        Review of Systems  Constitutional:  Negative for appetite change, chills, fatigue, fever and unexpected weight change.  Eyes:  Negative for visual disturbance.  Respiratory:  Negative for cough, chest tightness, shortness of breath and wheezing.   Cardiovascular:  Negative for chest pain and leg swelling.  Gastrointestinal:  Negative for abdominal pain, constipation,  diarrhea, nausea and vomiting.  Genitourinary:  Negative for dysuria, flank pain, frequency, genital sores, hematuria and urgency.  Skin:  Negative for rash.  Allergic/Immunologic: Negative for immunocompromised state.  Neurological:  Negative for dizziness and headaches.      Objective:    BP 130/80   Pulse 62   Temp 97.7 F (36.5 C) (Oral)   Ht 5' 9.5" (1.765 m)   Wt 252 lb (114.3 kg)   BMI 36.68 kg/m  Nursing note and vital signs reviewed.  Physical Exam Constitutional:      General: He is not in acute distress.     Appearance: He is well-developed.  Eyes:     Conjunctiva/sclera: Conjunctivae normal.  Cardiovascular:     Rate and Rhythm: Normal rate and regular rhythm.     Heart sounds: Normal heart sounds. No murmur heard.    No friction rub. No gallop.  Pulmonary:     Effort: Pulmonary effort is normal. No respiratory distress.     Breath sounds: Normal breath sounds. No wheezing or rales.  Chest:     Chest wall: No tenderness.  Abdominal:     General: Bowel sounds are normal.     Palpations: Abdomen is soft.     Tenderness: There is no abdominal tenderness.  Musculoskeletal:     Cervical back: Neck supple.  Lymphadenopathy:     Cervical: No cervical adenopathy.  Skin:    General: Skin is warm and dry.     Findings: No rash.  Neurological:     Mental Status: He is alert and oriented to person, place, and time.  Psychiatric:        Behavior: Behavior normal.        Thought Content: Thought content normal.        Judgment: Judgment normal.         04/03/2022    4:21 PM 12/04/2021    3:56 PM 07/24/2021    4:11 PM 04/22/2021    3:17 PM 06/19/2020    3:56 PM  Depression screen PHQ 2/9  Decreased Interest 0 0 0 0 0  Down, Depressed, Hopeless 0 0 0 0 3  PHQ - 2 Score 0 0 0 0 3  Altered sleeping     0  Tired, decreased energy     3  Change in appetite     0  Feeling bad or failure about yourself      0  Trouble concentrating     0  Moving slowly or fidgety/restless     0  Suicidal thoughts     0  PHQ-9 Score     6  Difficult doing work/chores     Not difficult at all       Assessment & Plan:    Patient Active Problem List   Diagnosis Date Noted   Obesity (BMI 30-39.9) 12/05/2021   Elevated blood sugar 04/22/2021   Healthcare maintenance 05/17/2020   Acute cholecystitis due to biliary calculus 05/11/2020   HIV (human immunodeficiency virus infection) ([place]) 04/20/2020   Pneumonia due to COVID-19 virus 04/19/2020   Hypogonadism in male 07/31/2015   Erectile dysfunction of  organic origin 07/31/2015   Fatigue 05/13/2011   Decreased libido 05/13/2011   Pain, joint, ankle, left 05/13/2011   Ankle pain, right 05/13/2011   CARPAL TUNNEL SYNDROME, RIGHT 01/11/2010   DRY SKIN 12/10/2009   ALLERGIC RHINITIS 12/06/2009     Problem List Items Addressed This Visit       Other  HIV (human immunodeficiency virus infection) [facility])    Mr. Hora continues to have well-controlled virus with good adherence and tolerance to [employer].  Reviewed previous lab work and discussed plan of care.  Lab work drawn through Nurse, children's study.  Continue current dose of Biktarvy.  Financial assistance renewed.  Plan for follow-up in 6 months or sooner if needed with lab work through research.      Relevant Medications   bictegravir-emtricitabine-tenofovir AF (BIKTARVY) 50-200-25 MG TABS tablet   Healthcare maintenance    Discussed importance of safe sexual practices and condom use.  Condoms and STD testing offered. Routine vaccinations up-to-date per recommendations. Routine dental care up-to-date per recommendations. Due for colonoscopy for colon cancer screening.       Other Visit Diagnoses     Human immunodeficiency virus (HIV) disease ([place])    -  Primary   Relevant Medications   bictegravir-emtricitabine-tenofovir AF (BIKTARVY) 50-200-25 MG TABS tablet   Other Relevant Orders   HIV 1 RNA quant-no reflex-bld   T-helper cell (CD4)- ([facility] clinic only)   RPR        I am having Harrel Carina maintain his ascorbic acid, vitamin B-12, SUPER B COMPLEX/C PO, meclizine, fluticasone, and Biktarvy.   Meds ordered this encounter  Medications   bictegravir-emtricitabine-tenofovir AF (BIKTARVY) 50-200-25 MG TABS tablet    Sig: Take 1 tablet by mouth at bedtime.    Dispense:  30 tablet    Refill:  5    Order Specific Question:   Supervising Provider    Answer:   Carlyle Basques [[id number]]     Follow-up: Return in about 6 months (around 10/02/2022), or if symptoms worsen or fail  to improve.   Terri Piedra, MSN, FNP-C Nurse Practitioner [facility] [place] number: [phone]

## 2022-04-03 NOTE — Research (Signed)
Participant seen for Week 12 visit for study A5391,The Do-It Study (Doravirine for Persons with Excessive Weight Gain on Integrase Inhibitors and Tenofovir Alafenamide). Please call [phone] for any patient care concerns.  Overall participant is doing well, no complaints or concerns. Study procedures carried out per protocol.  Plan to see participant again in December for week 24 visit.

## 2022-04-03 NOTE — Patient Instructions (Addendum)
Nice to see you.  Continue to take your medication as prescribed.   Refills have been sent to the pharmacy.  Plan for follow up in 6 months or sooner if needed with lab work on the same day.  Have a great day and stay safe!

## 2022-04-03 NOTE — Assessment & Plan Note (Addendum)
   Discussed importance of safe sexual practices and condom use.  Condoms and STD testing offered.  Routine vaccinations up-to-date per recommendations.  Routine dental care up-to-date per recommendations.  Due for colonoscopy for colon cancer screening.

## 2022-04-04 ENCOUNTER — Telehealth: Payer: Self-pay

## 2022-04-04 LAB — COMPREHENSIVE METABOLIC PANEL
AG Ratio: 1.6 (calc) (ref 1.0–2.5)
ALT: 12 U/L (ref 9–46)
AST: 15 U/L (ref 10–35)
Albumin: 4.3 g/dL (ref 3.6–5.1)
Alkaline phosphatase (APISO): 75 U/L (ref 35–144)
BUN: 13 mg/dL (ref 7–25)
CO2: 25 mmol/L (ref 20–32)
Calcium: 9.1 mg/dL (ref 8.6–10.3)
Chloride: 103 mmol/L (ref 98–110)
Creat: 1 mg/dL (ref 0.70–1.30)
Globulin: 2.7 g/dL (calc) (ref 1.9–3.7)
Glucose, Bld: 88 mg/dL (ref 65–99)
Potassium: 4 mmol/L (ref 3.5–5.3)
Sodium: 137 mmol/L (ref 135–146)
Total Bilirubin: 1 mg/dL (ref 0.2–1.2)
Total Protein: 7 g/dL (ref 6.1–8.1)

## 2022-04-04 LAB — T-HELPER CELL (CD4) - (RCID CLINIC ONLY)
CD4 % Helper T Cell: 30 % — ABNORMAL LOW (ref 33–65)
CD4 T Cell Abs: 530 /uL (ref 400–1790)

## 2022-04-04 LAB — BILIRUBIN, DIRECT: Bilirubin, Direct: 0.2 mg/dL (ref 0.0–0.2)

## 2022-04-04 LAB — PHOSPHORUS: Phosphorus: 2.9 mg/dL (ref 2.5–4.5)

## 2022-04-04 NOTE — Telephone Encounter (Signed)
[facility] Patient Advocate Encounter  Completed and sent [employer] Advancing Access application for Biktarvy for this patient who is uninsured.    Patient is approved 04/04/22 through 04/04/23.       Clearance Coots, CPhT Specialty Pharmacy Patient [facility] Phone: [phone] Fax:  [phone]

## 2022-04-06 LAB — HIV-1 RNA QUANT-NO REFLEX-BLD
HIV 1 RNA Quant: NOT DETECTED Copies/mL
HIV-1 RNA Quant, Log: NOT DETECTED Log cps/mL

## 2022-04-06 LAB — RPR: RPR Ser Ql: NONREACTIVE

## 2022-04-08 IMAGING — RF DG CHOLANGIOGRAM OPERATIVE
1 series · 4 of 4 positions shown · non-contrast
Comparison: Right upper quadrant abdominal ultrasound-05/13/2020

CLINICAL DATA: Intraoperative cholangiogram during laparoscopic
cholecystectomy.

EXAM:
INTRAOPERATIVE CHOLANGIOGRAM
FLUOROSCOPY TIME:  19 seconds (7 mGy)

[Series 1: unknown protocol · 0.20mm/px · 4 of 53 frames shown]
[frame 8/53]
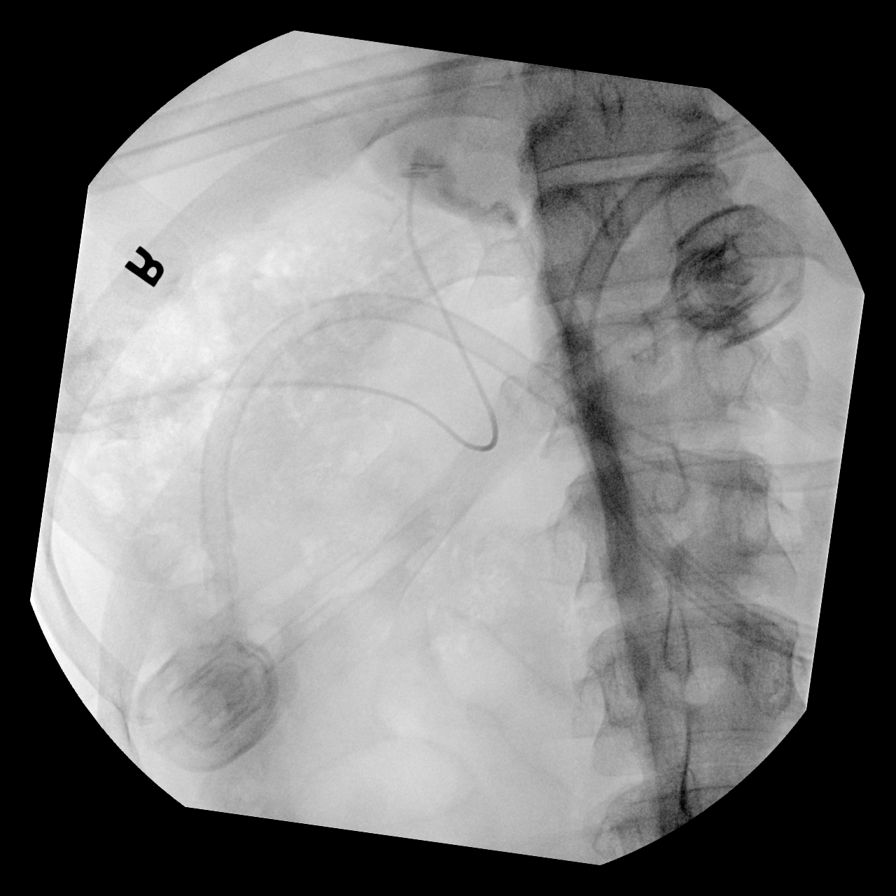
[frame 27/53]
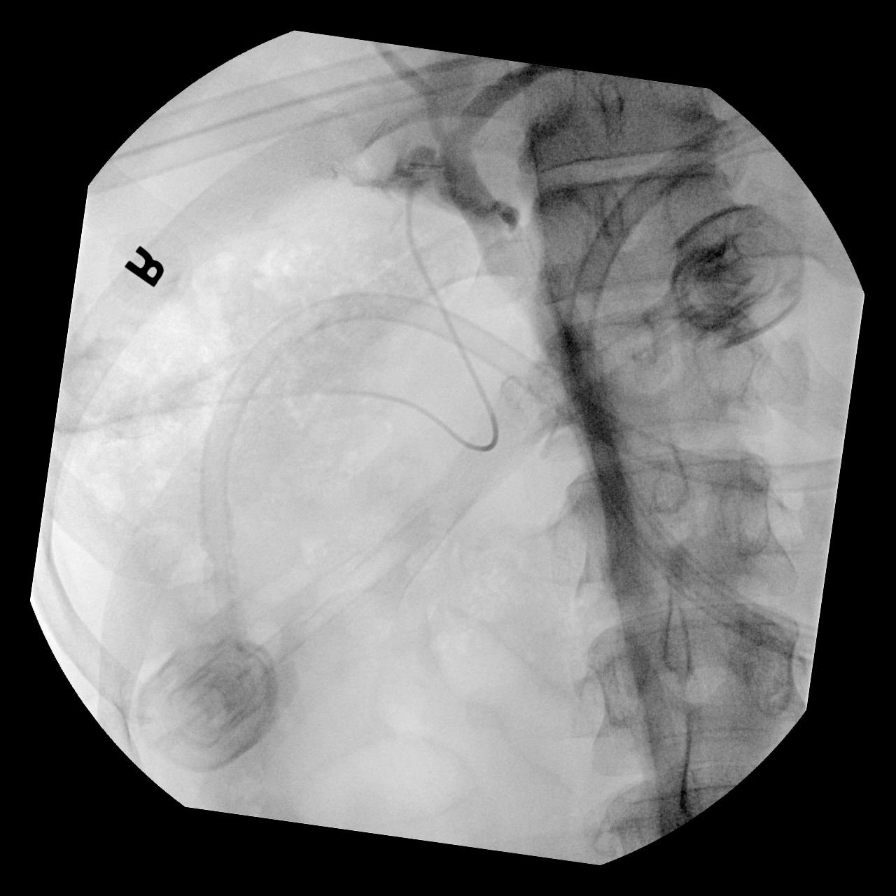
[frame 44/53]
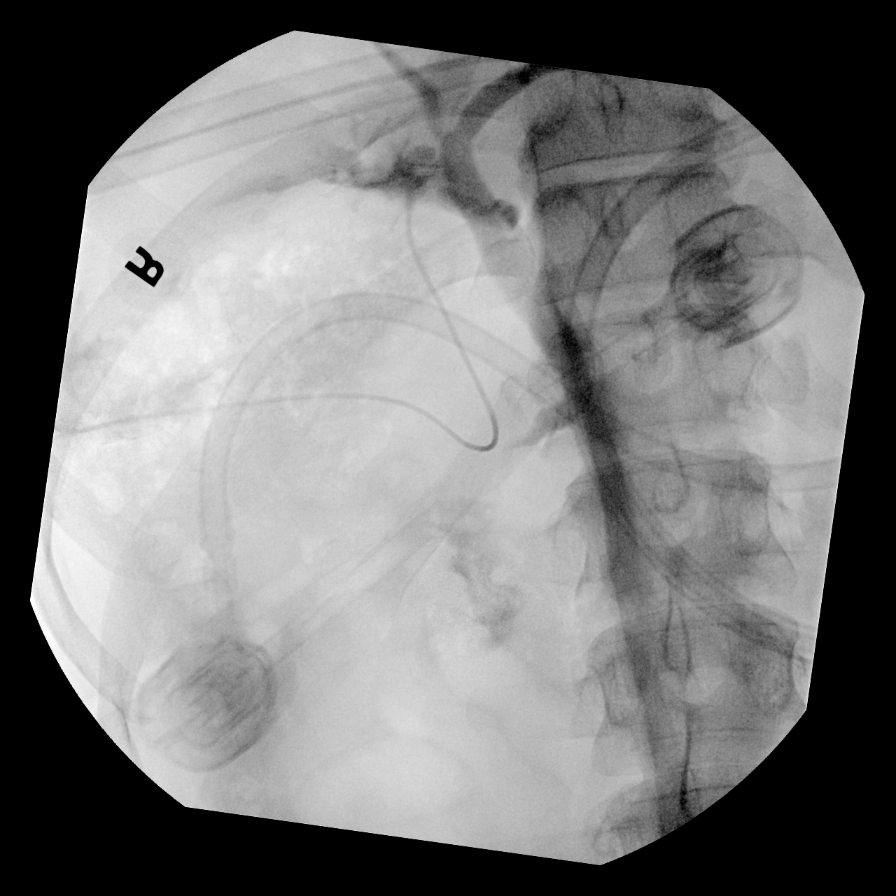
[frame 46/53]
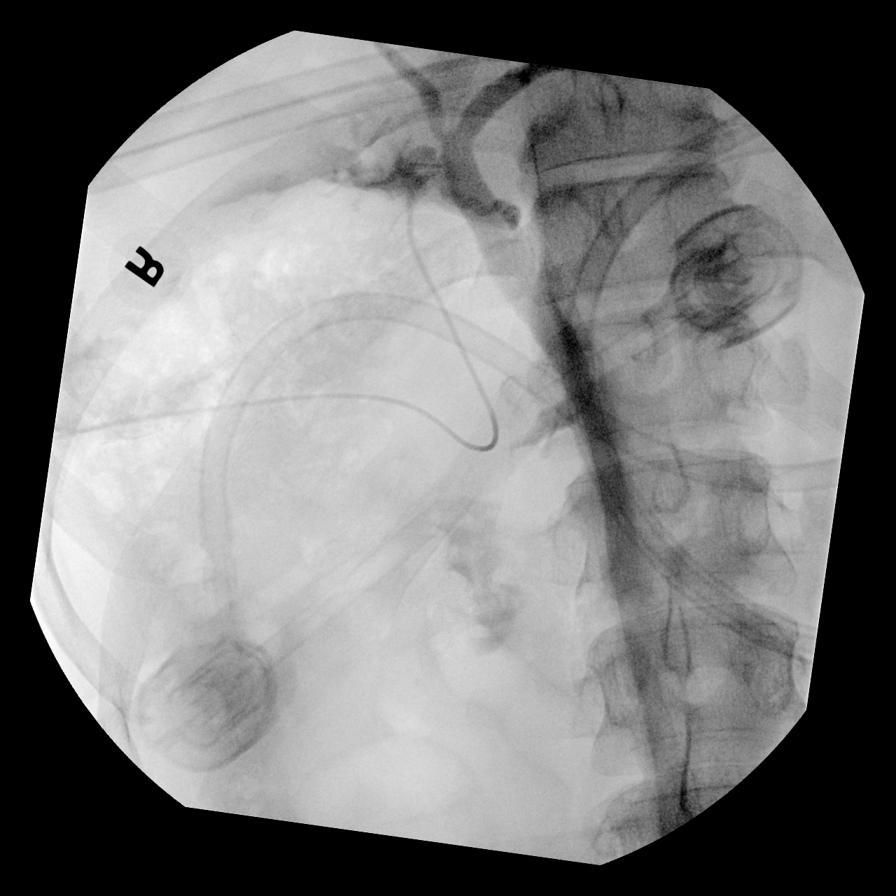

[4 of 4 positions shown; findings below may reference images not displayed]

FINDINGS: Intraoperative cholangiographic images of the right upper abdominal
quadrant during laparoscopic cholecystectomy are provided for
review.

Surgical clips overlie the expected location of the gallbladder
fossa.

Contrast injection demonstrates selective cannulation of the central
aspect of the cystic duct.

There is passage of contrast through the central aspect of the
cystic duct with filling of a mildly dilated common bile duct. There
is passage of contrast though the CBD and into the descending
portion of the duodenum.

There is minimal reflux of injected contrast into the common hepatic
duct and central aspect of the non dilated intrahepatic biliary
system.

There are no discrete filling defects within the opacified portions
of the biliary system to suggest the presence of
choledocholithiasis.
IMPRESSION: No evidence of choledocholithiasis.

## 2022-04-22 ENCOUNTER — Encounter: Payer: Self-pay | Admitting: Family

## 2022-06-23 ENCOUNTER — Ambulatory Visit (INDEPENDENT_AMBULATORY_CARE_PROVIDER_SITE_OTHER): Payer: Self-pay

## 2022-06-23 ENCOUNTER — Other Ambulatory Visit: Payer: Self-pay

## 2022-06-23 ENCOUNTER — Encounter (INDEPENDENT_AMBULATORY_CARE_PROVIDER_SITE_OTHER): Payer: Self-pay | Admitting: *Deleted

## 2022-06-23 VITALS — BP 137/87 | HR 64 | Temp 98.5°F | Wt 258.1 lb

## 2022-06-23 DIAGNOSIS — Z006 Encounter for examination for normal comparison and control in clinical research program: Secondary | ICD-10-CM

## 2022-06-23 DIAGNOSIS — Z23 Encounter for immunization: Secondary | ICD-10-CM

## 2022-06-24 LAB — COMPREHENSIVE METABOLIC PANEL
AG Ratio: 1.7 (calc) (ref 1.0–2.5)
ALT: 12 U/L (ref 9–46)
AST: 14 U/L (ref 10–35)
Albumin: 4.3 g/dL (ref 3.6–5.1)
Alkaline phosphatase (APISO): 73 U/L (ref 35–144)
BUN: 17 mg/dL (ref 7–25)
CO2: 27 mmol/L (ref 20–32)
Calcium: 9 mg/dL (ref 8.6–10.3)
Chloride: 107 mmol/L (ref 98–110)
Creat: 1.11 mg/dL (ref 0.70–1.30)
Globulin: 2.6 g/dL (calc) (ref 1.9–3.7)
Glucose, Bld: 124 mg/dL — ABNORMAL HIGH (ref 65–99)
Potassium: 4.2 mmol/L (ref 3.5–5.3)
Sodium: 140 mmol/L (ref 135–146)
Total Bilirubin: 0.7 mg/dL (ref 0.2–1.2)
Total Protein: 6.9 g/dL (ref 6.1–8.1)

## 2022-06-24 LAB — LIPID PANEL
Cholesterol: 161 mg/dL (ref <200)
HDL: 53 mg/dL (ref >=40)
LDL Cholesterol (Calc): 91 mg/dL (calc)
Non-HDL Cholesterol (Calc): 108 mg/dL (calc) (ref <130)
Total CHOL/HDL Ratio: 3 (calc) (ref <5.0)
Triglycerides: 76 mg/dL (ref <150)

## 2022-06-24 LAB — URINALYSIS, COMPLETE
Bacteria, UA: NONE SEEN /HPF
Bilirubin Urine: NEGATIVE
Glucose, UA: NEGATIVE
Hgb urine dipstick: NEGATIVE
Hyaline Cast: NONE SEEN /LPF
Ketones, ur: NEGATIVE
Leukocytes,Ua: NEGATIVE
Nitrite: NEGATIVE
Protein, ur: NEGATIVE
RBC / HPF: NONE SEEN /HPF (ref 0–2)
Specific Gravity, Urine: 1.021 (ref 1.001–1.035)
Squamous Epithelial / HPF: NONE SEEN /HPF (ref <=5)
WBC, UA: NONE SEEN /HPF (ref 0–5)
pH: 6 (ref 5.0–8.0)

## 2022-06-24 LAB — PHOSPHORUS: Phosphorus: 2.5 mg/dL (ref 2.5–4.5)

## 2022-06-24 LAB — BILIRUBIN, DIRECT: Bilirubin, Direct: 0.2 mg/dL (ref 0.0–0.2)

## 2022-06-24 NOTE — Research (Signed)
Mario Simmons here for his week 24 visit for A5391 the Do IT study. No new complaints or medications. Verbalized excellent adherence with his Biktarvy. He will return in February for his next study visit.

## 2022-07-22 ENCOUNTER — Other Ambulatory Visit (HOSPITAL_COMMUNITY): Payer: Self-pay

## 2022-07-29 ENCOUNTER — Other Ambulatory Visit (HOSPITAL_COMMUNITY): Payer: Self-pay

## 2022-08-04 NOTE — Telephone Encounter (Signed)
Called [facility] regarding prescription. Per pharmacy team patient has multiple profiles and could be why they continue to have issues filling rx. Requested pharmacy either merge or delete profile to avoid further delay in patient care.  Prescription was able to be processed with a zero dollar copay. Will request patient call while at [facility] if he continues to have issues filling prescription.  Leatrice Jewels, RMA

## 2022-08-05 ENCOUNTER — Other Ambulatory Visit (HOSPITAL_COMMUNITY): Payer: Self-pay

## 2022-08-28 ENCOUNTER — Other Ambulatory Visit (HOSPITAL_COMMUNITY): Payer: Self-pay

## 2022-09-15 ENCOUNTER — Encounter (INDEPENDENT_AMBULATORY_CARE_PROVIDER_SITE_OTHER): Payer: Self-pay | Admitting: *Deleted

## 2022-09-15 ENCOUNTER — Other Ambulatory Visit: Payer: Self-pay

## 2022-09-15 VITALS — BP 117/74 | HR 61 | Temp 97.9°F | Wt 256.0 lb

## 2022-09-15 DIAGNOSIS — Z006 Encounter for examination for normal comparison and control in clinical research program: Secondary | ICD-10-CM

## 2022-09-15 NOTE — Research (Signed)
Mario Simmons here today for his week 36 visit for A5391, the Do It study. No new complaints or medications. He will return in May for his final study visit.

## 2022-10-09 ENCOUNTER — Other Ambulatory Visit: Payer: Self-pay

## 2022-10-09 ENCOUNTER — Ambulatory Visit (INDEPENDENT_AMBULATORY_CARE_PROVIDER_SITE_OTHER): Payer: Self-pay | Admitting: Family

## 2022-10-09 ENCOUNTER — Encounter: Payer: Self-pay | Admitting: Family

## 2022-10-09 VITALS — BP 133/85 | HR 59 | Temp 97.9°F | Ht 69.0 in | Wt 255.0 lb

## 2022-10-09 DIAGNOSIS — Z0000 Encounter for general adult medical examination without abnormal findings: Secondary | ICD-10-CM

## 2022-10-09 DIAGNOSIS — Z21 Asymptomatic human immunodeficiency virus [HIV] infection status: Secondary | ICD-10-CM

## 2022-10-09 DIAGNOSIS — R739 Hyperglycemia, unspecified: Secondary | ICD-10-CM

## 2022-10-09 MED ORDER — BIKTARVY 50-200-25 MG PO TABS: 1.0000 | ORAL_TABLET | Freq: Every day | ORAL | 5 refills | Status: DC

## 2022-10-09 NOTE — Assessment & Plan Note (Signed)
Mario Simmons has elevated blood sugar with no other symptoms. Check A1c to rule out pre-diabetes/diabetes.

## 2022-10-09 NOTE — Assessment & Plan Note (Signed)
Discussed importance of safe sexual practice and condom use. Condoms and STD testing offered.  Due for colon cancer screening and will consider Cologuard Shingrix vaccination recommended.

## 2022-10-09 NOTE — Assessment & Plan Note (Signed)
Mario Simmons continues to have well controlled virus with good adherence and tolerance to [employer]. Reviewed previous lab work and discussed plan of care. Check lab work. Continue current dose of Biktarvy. Check with financial assistance. Plan for follow up in 6 months or sooner if needed with lab work on the same day.

## 2022-10-09 NOTE — Progress Notes (Signed)
Brief Narrative   Patient ID: Mario Simmons, male    DOB: Mar 29, 1966, 57 y.o.   MRN: [id number]  Mario Simmons is a 57 y/o AA male diagnosed with HIV in 2019 with risk factor of heterosexual contact. Initial CD4, viral load and genotype unavailable. [id number] negative. History of Mycobacterium Avium Complex infection s/p treatment. Sole medication regimen of Biktarvy since initial diagnosis.    Subjective:    Chief Complaint  Patient presents with   Follow-up    HPI:  Mario Simmons is a 57 y.o. male with HIV disease last seen on 04/03/22 with well controlled virus and good adherence and tolerance to [employer]. Viral load was undetectable and CD4 count 530.  Renal function, liver function and electrolytes within normal ranges. Here today for follow up.  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 Continues to take Biktarvy with no adverse side effects or problems getting medication from the pharmacy. Has concerns about his blood sugar being elevated. Condoms and STD testing offered. Healthcare maintenance due includes Shingrix and colon cancer screening.   Denies fevers, chills, night sweats, headaches, changes in vision, neck pain/stiffness, nausea, diarrhea, vomiting, lesions or rashes.    Allergies  Allergen Reactions   Prednisone Other (See Comments)    "makes me have nightmares"      Outpatient Medications Prior to Visit  Medication Sig Dispense Refill   ascorbic acid (VITAMIN C) 500 MG tablet Take 500 mg by mouth at bedtime.      fluticasone (FLONASE) 50 MCG/ACT nasal spray Place 1 spray into both nostrils daily. 16 g 1   vitamin B-12 (CYANOCOBALAMIN) 50 MCG tablet Take 50 mcg by mouth at bedtime.      bictegravir-emtricitabine-tenofovir AF (BIKTARVY) 50-200-25 MG TABS tablet Take 1 tablet by mouth at bedtime. 30 tablet 5   Pseudoephedrine-guaiFENesin (MUCINEX D MAX  STRENGTH PO)      SUPER B COMPLEX/C PO Take 1 tablet by mouth at bedtime.      meclizine (ANTIVERT) 25 MG tablet Take 1 tablet (25 mg total) by mouth 3 (three) times daily as needed for dizziness. 30 tablet 0   No facility-administered medications prior to visit.     Past Medical History:  Diagnosis Date   Allergy    Carpal tunnel syndrome    COVID 04/19/2020   hospital 9/30-10/4/21   Depression    Dry skin    ED (erectile dysfunction)    HIV (human immunodeficiency virus infection) ([place])    mananged by Dr Drue Stager at [place] per pt on 06/06/20   Hypogonadism in male    Sleep apnea    uses cpap nightly     Past Surgical History:  Procedure Laterality Date   CHOLECYSTECTOMY N/A 06/07/2020   Procedure: LAPAROSCOPIC CHOLECYSTECTOMY WITH INTRAOPERATIVE CHOLANGIOGRAM AND UMBILICAL HERNIA REPAIR;  Surgeon: Dwan Bolt, MD;  Location: [place];  Service: General;  Laterality: N/A;   FRACTURE SURGERY Left 2010   middle and 4th finger   HERNIA REPAIR     WISDOM TOOTH EXTRACTION        Review of Systems  Constitutional:  Negative for appetite change, chills, fatigue, fever and unexpected weight change.  Eyes:  Negative for visual disturbance.  Respiratory:  Negative for cough, chest tightness, shortness of breath and wheezing.   Cardiovascular:  Negative for chest pain and leg swelling.  Gastrointestinal:  Negative for abdominal pain, constipation, diarrhea, nausea and vomiting.  Genitourinary:  Negative for dysuria, flank pain, frequency, genital sores, hematuria and urgency.  Skin:  Negative for rash.  Allergic/Immunologic: Negative for immunocompromised state.  Neurological:  Negative for dizziness and headaches.      Objective:    BP 133/85   Pulse (!) 59   Temp 97.9 F (36.6 C) (Oral)   Ht 5\' 9"  (1.753 m)   Wt 255 lb (115.7 kg)   SpO2 96%   BMI 37.66 kg/m  Nursing note and vital signs reviewed.  Physical Exam Constitutional:      General: He  is not in acute distress.    Appearance: He is well-developed.  Eyes:     Conjunctiva/sclera: Conjunctivae normal.  Cardiovascular:     Rate and Rhythm: Normal rate and regular rhythm.     Heart sounds: Normal heart sounds. No murmur heard.    No friction rub. No gallop.  Pulmonary:     Effort: Pulmonary effort is normal. No respiratory distress.     Breath sounds: Normal breath sounds. No wheezing or rales.  Chest:     Chest wall: No tenderness.  Abdominal:     General: Bowel sounds are normal.     Palpations: Abdomen is soft.     Tenderness: There is no abdominal tenderness.  Musculoskeletal:     Cervical back: Neck supple.  Lymphadenopathy:     Cervical: No cervical adenopathy.  Skin:    General: Skin is warm and dry.     Findings: No rash.  Neurological:     Mental Status: He is alert and oriented to person, place, and time.  Psychiatric:        Behavior: Behavior normal.        Thought Content: Thought content normal.        Judgment: Judgment normal.         10/09/2022    3:48 PM 04/03/2022    4:21 PM 12/04/2021    3:56 PM 07/24/2021    4:11 PM 04/22/2021    3:17 PM  Depression screen PHQ 2/9  Decreased Interest 0 0 0 0 0  Down, Depressed, Hopeless 0 0 0 0 0  PHQ - 2 Score 0 0 0 0 0       Assessment & Plan:    Patient Active Problem List   Diagnosis Date Noted   Obesity (BMI 30-39.9) 12/05/2021   Elevated blood sugar 04/22/2021   Healthcare maintenance 05/17/2020   Acute cholecystitis due to biliary calculus 05/11/2020   HIV (human immunodeficiency virus infection) ([place]) 04/20/2020   Pneumonia due to COVID-19 virus 04/19/2020   Hypogonadism in male 07/31/2015   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 Reviewed previous lab work and discussed plan of care. Check lab work. Continue current dose of Biktarvy. Check with financial assistance. Plan for follow up in 6 months or sooner if needed with lab work on the same day.      Relevant Medications  bictegravir-emtricitabine-tenofovir AF (BIKTARVY) 50-200-25 MG TABS tablet   Other Relevant Orders   BASIC METABOLIC PANEL WITH GFR   HIV-1 RNA quant-no reflex-bld   T-helper cell (CD4)- ([facility] clinic only)   Healthcare maintenance    Discussed importance of safe sexual practice and condom use. Condoms and STD testing offered.  Due for colon cancer screening and will consider Cologuard Shingrix vaccination recommended.       Elevated blood sugar    Jackston has elevated blood sugar with no other symptoms. Check A1c to rule out pre-diabetes/diabetes.       Relevant Orders   HgB A1c     I have discontinued Mario Simmons's meclizine and Pseudoephedrine-guaiFENesin (MUCINEX D MAX STRENGTH PO). I am also having him maintain his ascorbic acid, vitamin B-12, SUPER B COMPLEX/C PO, fluticasone, and Biktarvy.   Meds ordered this encounter  Medications   bictegravir-emtricitabine-tenofovir AF (BIKTARVY) 50-200-25 MG TABS tablet    Sig: Take 1 tablet by mouth at bedtime.    Dispense:  30 tablet    Refill:  5    Order Specific Question:   Supervising Provider    Answer:   Carlyle Basques [[id number]]     Follow-up: Return in about 6 months (around 04/11/2023), or if symptoms worsen or fail to improve.   Terri Piedra, MSN, FNP-C Nurse Practitioner [facility] [place] number: [phone]

## 2022-10-09 NOTE — Patient Instructions (Signed)
Nice to see you. ? ?We will check your lab work today. ? ?Continue to take your medication daily as prescribed. ? ?Refills have been sent to the pharmacy. ? ?Plan for follow up in 6 months or sooner if needed with lab work on the same day. ? ?Have a great day and stay safe! ? ?

## 2022-10-10 LAB — T-HELPER CELL (CD4) - (RCID CLINIC ONLY)
CD4 % Helper T Cell: 29 % — ABNORMAL LOW (ref 33–65)
CD4 T Cell Abs: 558 /uL (ref 400–1790)

## 2022-10-11 LAB — BASIC METABOLIC PANEL WITH GFR
BUN: 13 mg/dL (ref 7–25)
CO2: 26 mmol/L (ref 20–32)
Calcium: 9.6 mg/dL (ref 8.6–10.3)
Chloride: 103 mmol/L (ref 98–110)
Creat: 1.09 mg/dL (ref 0.70–1.30)
Glucose, Bld: 91 mg/dL (ref 65–99)
Potassium: 4.2 mmol/L (ref 3.5–5.3)
Sodium: 139 mmol/L (ref 135–146)
eGFR: 79 mL/min/{1.73_m2} (ref >=60)

## 2022-10-11 LAB — HEMOGLOBIN A1C
Hgb A1c MFr Bld: 6 % of total Hgb — ABNORMAL HIGH (ref <5.7)
Mean Plasma Glucose: 126 mg/dL
eAG (mmol/L): 7 mmol/L

## 2022-10-11 LAB — HIV-1 RNA QUANT-NO REFLEX-BLD
HIV 1 RNA Quant: <20 Copies/mL — ABNORMAL HIGH
HIV-1 RNA Quant, Log: <1.3 Log cps/mL — ABNORMAL HIGH

## 2022-12-05 ENCOUNTER — Other Ambulatory Visit: Payer: Self-pay

## 2022-12-05 ENCOUNTER — Encounter (INDEPENDENT_AMBULATORY_CARE_PROVIDER_SITE_OTHER): Payer: Self-pay | Admitting: *Deleted

## 2022-12-05 VITALS — BP 128/84 | HR 62 | Temp 97.5°F | Wt 247.0 lb

## 2022-12-05 DIAGNOSIS — Z006 Encounter for examination for normal comparison and control in clinical research program: Secondary | ICD-10-CM

## 2022-12-05 NOTE — Research (Signed)
Fawkes was here today for his last visit for A5391. He denies any new health problems, continues to c/o arthritic pain. We did discuss the possibility of him starting a statin drug due to the new recommendations from the [employer] study. He said he would talk it over with his Md. He also got a whole body DEXA scan today as part of the study.

## 2022-12-06 LAB — COMPREHENSIVE METABOLIC PANEL
AG Ratio: 1.4 (calc) (ref 1.0–2.5)
ALT: 11 U/L (ref 9–46)
AST: 15 U/L (ref 10–35)
Albumin: 4.3 g/dL (ref 3.6–5.1)
Alkaline phosphatase (APISO): 82 U/L (ref 35–144)
BUN: 13 mg/dL (ref 7–25)
CO2: 24 mmol/L (ref 20–32)
Calcium: 9.5 mg/dL (ref 8.6–10.3)
Chloride: 107 mmol/L (ref 98–110)
Creat: 1.23 mg/dL (ref 0.70–1.30)
Globulin: 3 g/dL (calc) (ref 1.9–3.7)
Glucose, Bld: 111 mg/dL — ABNORMAL HIGH (ref 65–99)
Potassium: 4.2 mmol/L (ref 3.5–5.3)
Sodium: 139 mmol/L (ref 135–146)
Total Bilirubin: 1.1 mg/dL (ref 0.2–1.2)
Total Protein: 7.3 g/dL (ref 6.1–8.1)

## 2022-12-06 LAB — LIPID PANEL
Cholesterol: 157 mg/dL (ref <200)
HDL: 50 mg/dL (ref >=40)
LDL Cholesterol (Calc): 93 mg/dL (calc)
Non-HDL Cholesterol (Calc): 107 mg/dL (calc) (ref <130)
Total CHOL/HDL Ratio: 3.1 (calc) (ref <5.0)
Triglycerides: 55 mg/dL (ref <150)

## 2022-12-06 LAB — URINALYSIS, ROUTINE W REFLEX MICROSCOPIC
Bilirubin Urine: NEGATIVE
Glucose, UA: NEGATIVE
Hgb urine dipstick: NEGATIVE
Ketones, ur: NEGATIVE
Leukocytes,Ua: NEGATIVE
Nitrite: NEGATIVE
Protein, ur: NEGATIVE
Specific Gravity, Urine: 1.023 (ref 1.001–1.035)
pH: 5.5 (ref 5.0–8.0)

## 2022-12-06 LAB — PHOSPHORUS: Phosphorus: 2.6 mg/dL (ref 2.5–4.5)

## 2022-12-06 LAB — BILIRUBIN, DIRECT: Bilirubin, Direct: 0.2 mg/dL (ref 0.0–0.2)

## 2023-01-06 NOTE — Progress Notes (Signed)
The 10-year ASCVD risk score (Arnett DK, et al., 2019) is: 6.8%   Values used to calculate the score:     Age: 57 years     Sex: Male     Is Non-Hispanic African American: Yes     Diabetic: No     Tobacco smoker: No     Systolic Blood Pressure: 128 mmHg     Is BP treated: No     HDL Cholesterol: 50 mg/dL     Total Cholesterol: 157 mg/dL  Sandie Ano, RN

## 2023-04-20 ENCOUNTER — Ambulatory Visit: Payer: Self-pay | Admitting: Family

## 2023-04-26 ENCOUNTER — Other Ambulatory Visit: Payer: Self-pay | Admitting: Family

## 2023-04-26 DIAGNOSIS — Z21 Asymptomatic human immunodeficiency virus [HIV] infection status: Secondary | ICD-10-CM

## 2023-05-29 ENCOUNTER — Other Ambulatory Visit: Payer: Self-pay | Admitting: Family

## 2023-05-29 ENCOUNTER — Telehealth: Payer: Self-pay

## 2023-05-29 DIAGNOSIS — Z21 Asymptomatic human immunodeficiency virus [HIV] infection status: Secondary | ICD-10-CM

## 2023-05-29 NOTE — Telephone Encounter (Signed)
Patient due for appointment with Tammy Sours. Called to schedule, no answer. Left HIPAA compliant voicemail requesting callback.   Sandie Ano, RN

## 2023-06-25 ENCOUNTER — Other Ambulatory Visit: Payer: Self-pay | Admitting: Family

## 2023-06-25 DIAGNOSIS — Z21 Asymptomatic human immunodeficiency virus [HIV] infection status: Secondary | ICD-10-CM

## 2023-07-21 ENCOUNTER — Ambulatory Visit: Payer: Self-pay | Admitting: Family

## 2023-07-26 ENCOUNTER — Other Ambulatory Visit: Payer: Self-pay | Admitting: Family

## 2023-07-26 DIAGNOSIS — Z21 Asymptomatic human immunodeficiency virus [HIV] infection status: Secondary | ICD-10-CM

## 2023-08-14 ENCOUNTER — Ambulatory Visit: Payer: Self-pay | Admitting: Family

## 2023-08-24 ENCOUNTER — Other Ambulatory Visit: Payer: Self-pay | Admitting: Family

## 2023-08-24 DIAGNOSIS — Z21 Asymptomatic human immunodeficiency virus [HIV] infection status: Secondary | ICD-10-CM

## 2023-09-07 ENCOUNTER — Other Ambulatory Visit: Payer: Self-pay

## 2023-09-07 ENCOUNTER — Ambulatory Visit: Payer: Self-pay

## 2023-09-07 ENCOUNTER — Encounter: Payer: Self-pay | Admitting: Family

## 2023-09-07 ENCOUNTER — Ambulatory Visit (INDEPENDENT_AMBULATORY_CARE_PROVIDER_SITE_OTHER): Payer: Self-pay | Admitting: Family

## 2023-09-07 VITALS — BP 135/86 | HR 65 | Wt 236.6 lb

## 2023-09-07 DIAGNOSIS — Z113 Encounter for screening for infections with a predominantly sexual mode of transmission: Secondary | ICD-10-CM

## 2023-09-07 DIAGNOSIS — Z560 Unemployment, unspecified: Secondary | ICD-10-CM | POA: Insufficient documentation

## 2023-09-07 DIAGNOSIS — Z21 Asymptomatic human immunodeficiency virus [HIV] infection status: Secondary | ICD-10-CM

## 2023-09-07 DIAGNOSIS — Z0000 Encounter for general adult medical examination without abnormal findings: Secondary | ICD-10-CM

## 2023-09-07 DIAGNOSIS — B20 Human immunodeficiency virus [HIV] disease: Secondary | ICD-10-CM

## 2023-09-07 NOTE — Patient Instructions (Addendum)
Nice to see you.  Continue to take your medication daily as prescribed.  Refills have been sent to the pharmacy.  Plan for follow up in 1 months or sooner if needed with lab work on the same day.  Have a great day and stay safe!  

## 2023-09-07 NOTE — Assessment & Plan Note (Signed)
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 Following discussion he will speak with our counselor to aid in coping mechanisms. Social determinants of health are met. Will meet with financial counselor tomorrow to determine coverage options.

## 2023-09-07 NOTE — Assessment & Plan Note (Signed)
[name] continues to have well controlled virus and good adherence and tolerance to [employer]. Reviewed previous lab work and discussed plan of care. Discussed possibility of FMLA vs virtual visits to help with easing burden of missing work in the future if needed. Will need to meet with financial team tomorrow. Samples of Biktarvy provided to bridge to new coverage/assistance. Social determinants of health appear to be met at present. Plan for follow up in 1 month to ensure connection to resources.

## 2023-09-07 NOTE — Assessment & Plan Note (Signed)
Discussed importance of safe sexual practice and condom use. Condoms and site specific STD testing offered.  Vaccinations reviewed. Declined for now.  Due for colon cancer screening and will refer once financial assistance/insurance is secured.

## 2023-09-07 NOTE — Progress Notes (Signed)
Brief Narrative   Patient ID: Mario Simmons, male    DOB: 1965/11/17, 58 y.o.   MRN: [id number]  Mario Simmons is a 58 y/o AA male diagnosed with HIV in 2019 with risk factor of heterosexual contact. Initial CD4, viral load and genotype unavailable. [id number] negative. History of Mycobacterium Avium Complex infection s/p treatment. Sole medication regimen of Biktarvy since initial diagnosis.    Subjective:    Chief Complaint  Patient presents with   Follow-up    B20     HPI:  Mario Simmons is a 58 y.o. male with HIV disease last seen on 10/09/2022 with well-controlled virus and good adherence and tolerance to [employer].  Viral load was undetectable with CD4 count 558.  Kidney function, liver function, electrolytes within normal ranges.  Lipid profile triglycerides 55, LDL 93, and HDL 50.  Here today for routine follow-up.  Mario Simmons has not been doing well since his last office visit and has had difficulties getting time off of work to attend appointments.  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  This has resulted in a significant amount of stress and frustration.  Currently without insurance.  Continues to take Biktarvy as prescribed with no adverse side effects.  Has approximately 3 weeks of Biktarvy remaining.  Currently has stable housing and good access to food.  Transportation is via [employer].  Unclear as to his employment pathway forward.  Condoms and STD testing offered.  Healthcare maintenance reviewed.  Denies fevers, chills, night sweats, headaches, changes in vision, neck pain/stiffness, nausea, diarrhea, vomiting, lesions or rashes.  Lab Results  Component Value Date   CD4TCELL 29 (L) 10/09/2022   CD4TABS 558 10/09/2022   Lab Results  Component Value Date   HIV1RNAQUANT <20 (H) 10/09/2022     Allergies  Allergen Reactions   Prednisone Other (See Comments)    "makes me have nightmares"      Outpatient Medications  Prior to Visit  Medication Sig Dispense Refill   BIKTARVY 50-200-25 MG TABS tablet TAKE 1 TABLET BY MOUTH AT BEDTIME 30 tablet 0   dextromethorphan-guaiFENesin (MUCINEX DM) 30-600 MG 12hr tablet Take 1 tablet by mouth 2 (two) times daily.     fluticasone (FLONASE) 50 MCG/ACT nasal spray Place 1 spray into both nostrils daily. 16 g 1   ascorbic acid (VITAMIN C) 500 MG tablet Take 500 mg by mouth at bedtime.      SUPER B COMPLEX/C PO Take 1 tablet by mouth at bedtime.      vitamin B-12 (CYANOCOBALAMIN) 50 MCG tablet Take 50 mcg by mouth at bedtime.      No facility-administered medications prior to visit.     Past Medical History:  Diagnosis Date   Allergy    Carpal tunnel syndrome    COVID 04/19/2020   hospital 9/30-10/4/21   Depression    Dry skin    ED (erectile dysfunction)    HIV (human immunodeficiency virus infection) (HCC)    mananged by Dr Danna Hefty at [facility] per pt on 06/06/20   Hypogonadism in male    Sleep apnea    uses cpap nightly     Past Surgical History:  Procedure Laterality Date   CHOLECYSTECTOMY N/A 06/07/2020   Procedure: LAPAROSCOPIC CHOLECYSTECTOMY WITH INTRAOPERATIVE CHOLANGIOGRAM AND UMBILICAL HERNIA REPAIR;  Surgeon: Fritzi Mandes, MD;  Location: [facility] OR;  Service: General;  Laterality: N/A;   FRACTURE SURGERY Left 2010  middle and 4th finger   HERNIA REPAIR     WISDOM TOOTH EXTRACTION        Review of Systems  Constitutional:  Negative for appetite change, chills, fatigue, fever and unexpected weight change.  Eyes:  Negative for visual disturbance.  Respiratory:  Negative for cough, chest tightness, shortness of breath and wheezing.   Cardiovascular:  Negative for chest pain and leg swelling.  Gastrointestinal:  Negative for abdominal pain, constipation, diarrhea, nausea and vomiting.  Genitourinary:  Negative for dysuria, flank pain, frequency, genital sores, hematuria and urgency.  Skin:  Negative for rash.   Allergic/Immunologic: Negative for immunocompromised state.  Neurological:  Negative for dizziness and headaches.      Objective:    BP 135/86   Pulse 65   Wt 236 lb 9.6 oz (107.3 kg)   SpO2 99%   BMI 34.94 kg/m  Nursing note and vital signs reviewed.  Physical Exam Constitutional:      General: He is not in acute distress.    Appearance: He is well-developed.  Eyes:     Conjunctiva/sclera: Conjunctivae normal.  Cardiovascular:     Rate and Rhythm: Normal rate and regular rhythm.     Heart sounds: Normal heart sounds. No murmur heard.    No friction rub. No gallop.  Pulmonary:     Effort: Pulmonary effort is normal. No respiratory distress.     Breath sounds: Normal breath sounds. No wheezing or rales.  Chest:     Chest wall: No tenderness.  Abdominal:     General: Bowel sounds are normal.     Palpations: Abdomen is soft.     Tenderness: There is no abdominal tenderness.  Musculoskeletal:     Cervical back: Neck supple.  Lymphadenopathy:     Cervical: No cervical adenopathy.  Skin:    General: Skin is warm and dry.     Findings: No rash.  Neurological:     Mental Status: He is alert and oriented to person, place, and time.  Psychiatric:        Behavior: Behavior normal.        Thought Content: Thought content normal.        Judgment: Judgment normal.         09/07/2023    4:33 PM 10/09/2022    3:48 PM 04/03/2022    4:21 PM 12/04/2021    3:56 PM 07/24/2021    4:11 PM  Depression screen PHQ 2/9  Decreased Interest 0 0 0 0 0  Down, Depressed, Hopeless 0 0 0 0 0  PHQ - 2 Score 0 0 0 0 0       Assessment & Plan:    Patient Active Problem List   Diagnosis Date Noted   Loss of job 09/07/2023   Obesity (BMI 30-39.9) 12/05/2021   Elevated blood sugar 04/22/2021   Healthcare maintenance 05/17/2020   Acute cholecystitis due to biliary calculus 05/11/2020   HIV (human immunodeficiency virus infection) (HCC) 04/20/2020   Pneumonia due to COVID-19 virus  04/19/2020   Hypogonadism in male 07/31/2015   Erectile dysfunction of organic origin 07/31/2015   Fatigue 05/13/2011   Decreased libido 05/13/2011   Pain, joint, ankle, left 05/13/2011   Ankle pain, right 05/13/2011   CARPAL TUNNEL SYNDROME, RIGHT 01/11/2010   DRY SKIN 12/10/2009   Allergic rhinitis 12/06/2009     Problem List Items Addressed This Visit       Other   HIV (human immunodeficiency virus infection) (HCC) - Primary  Mario Simmons continues to have well controlled virus and good adherence and tolerance to [employer]. Reviewed previous lab work and discussed plan of care. Discussed possibility of FMLA vs virtual visits to help with easing burden of missing work in the future if needed. Will need to meet with financial team tomorrow. Samples of Biktarvy provided to bridge to new coverage/assistance. Social determinants of health appear to be met at present. Plan for follow up in 1 month to ensure connection to resources.       Relevant Orders   COMPLETE METABOLIC PANEL WITH GFR   HIV-1 RNA quant-no reflex-bld   T-helper cell (CD4)- ([facility] clinic only)   Healthcare maintenance   Discussed importance of safe sexual practice and condom use. Condoms and site specific STD testing offered.  Vaccinations reviewed. Declined for now.  Due for colon cancer screening and will refer once financial assistance/insurance is secured.       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 Following discussion he will speak with our counselor to aid in coping mechanisms. Social determinants of health are met. Will meet with financial counselor tomorrow to determine coverage options.       Other Visit Diagnoses       Screening for STDs (sexually transmitted diseases)       Relevant Orders   RPR        I am having Venida Jarvis maintain his ascorbic acid, vitamin B-12, SUPER B COMPLEX/C PO, fluticasone, Biktarvy, and  dextromethorphan-guaiFENesin.   No orders of the defined types were placed in this encounter.    Follow-up: Return in about 1 month (around 10/05/2023). or sooner if needed.    Marcos Eke, MSN, FNP-C Nurse Practitioner [facility] for Infectious Disease [facility] Main number: [phone]

## 2023-09-08 ENCOUNTER — Other Ambulatory Visit: Payer: Self-pay

## 2023-09-08 ENCOUNTER — Ambulatory Visit: Payer: Self-pay | Admitting: Licensed Clinical Social Worker

## 2023-09-08 ENCOUNTER — Ambulatory Visit: Payer: Self-pay

## 2023-09-08 ENCOUNTER — Other Ambulatory Visit (HOSPITAL_COMMUNITY): Payer: Self-pay

## 2023-09-08 LAB — T-HELPER CELL (CD4) - (RCID CLINIC ONLY)
CD4 % Helper T Cell: 25 % — ABNORMAL LOW (ref 33–65)
CD4 T Cell Abs: 500 /uL (ref 400–1790)

## 2023-09-09 ENCOUNTER — Other Ambulatory Visit: Payer: Self-pay | Admitting: Pharmacist

## 2023-09-09 LAB — COMPLETE METABOLIC PANEL WITH GFR
AG Ratio: 1.7 (calc) (ref 1.0–2.5)
ALT: 16 U/L (ref 9–46)
AST: 16 U/L (ref 10–35)
Albumin: 4.3 g/dL (ref 3.6–5.1)
Alkaline phosphatase (APISO): 72 U/L (ref 35–144)
BUN: 11 mg/dL (ref 7–25)
CO2: 29 mmol/L (ref 20–32)
Calcium: 9.5 mg/dL (ref 8.6–10.3)
Chloride: 105 mmol/L (ref 98–110)
Creat: 0.91 mg/dL (ref 0.70–1.30)
Globulin: 2.6 g/dL (ref 1.9–3.7)
Glucose, Bld: 101 mg/dL — ABNORMAL HIGH (ref 65–99)
Potassium: 4.2 mmol/L (ref 3.5–5.3)
Sodium: 140 mmol/L (ref 135–146)
Total Bilirubin: 0.9 mg/dL (ref 0.2–1.2)
Total Protein: 6.9 g/dL (ref 6.1–8.1)
eGFR: 98 mL/min/{1.73_m2} (ref >=60)

## 2023-09-09 LAB — HIV-1 RNA QUANT-NO REFLEX-BLD
HIV 1 RNA Quant: NOT DETECTED {copies}/mL
HIV-1 RNA Quant, Log: NOT DETECTED {Log}

## 2023-09-09 LAB — RPR: RPR Ser Ql: NONREACTIVE

## 2023-09-09 MED ORDER — BICTEGRAVIR-EMTRICITAB-TENOFOV 50-200-25 MG PO TABS: 1.0000 | ORAL_TABLET | Freq: Every day | ORAL | Status: AC

## 2023-09-09 NOTE — Progress Notes (Signed)
Medication Samples have been provided to the patient.  Drug name: Biktarvy        Strength: 50/200/25 mg       Qty: 21 tablets (3 bottles) LOT: [id number]   Exp.Date: 6/27  Dosing instructions: Take one tablet by mouth once daily  The patient has been instructed regarding the correct time, dose, and frequency of taking this medication, including desired effects and most common side effects.   Margarite Gouge, PharmD, CPP, BCIDP, AAHIVP Clinical Pharmacist Practitioner Infectious Diseases Clinical Pharmacist [facility] for Infectious Disease

## 2023-09-21 ENCOUNTER — Other Ambulatory Visit: Payer: Self-pay | Admitting: Family

## 2023-09-21 DIAGNOSIS — Z21 Asymptomatic human immunodeficiency virus [HIV] infection status: Secondary | ICD-10-CM

## 2023-10-08 ENCOUNTER — Other Ambulatory Visit: Payer: Self-pay

## 2023-10-08 ENCOUNTER — Encounter: Payer: Self-pay | Admitting: Family

## 2023-10-08 ENCOUNTER — Ambulatory Visit (INDEPENDENT_AMBULATORY_CARE_PROVIDER_SITE_OTHER): Payer: Self-pay | Admitting: Family

## 2023-10-08 VITALS — BP 131/87 | HR 92 | Ht 69.0 in | Wt 240.0 lb

## 2023-10-08 DIAGNOSIS — Z21 Asymptomatic human immunodeficiency virus [HIV] infection status: Secondary | ICD-10-CM

## 2023-10-08 DIAGNOSIS — Z560 Unemployment, unspecified: Secondary | ICD-10-CM | POA: Diagnosis not present

## 2023-10-08 DIAGNOSIS — Z0000 Encounter for general adult medical examination without abnormal findings: Secondary | ICD-10-CM | POA: Diagnosis not present

## 2023-10-08 DIAGNOSIS — Z23 Encounter for immunization: Secondary | ICD-10-CM

## 2023-10-08 MED ORDER — ZOSTER VAC RECOMB ADJUVANTED 50 MCG/0.5ML IM SUSR: 0.5000 mL | Freq: Once | INTRAMUSCULAR | 1 refills | Status: AC

## 2023-10-08 MED ORDER — BIKTARVY 50-200-25 MG PO TABS
1.0000 | ORAL_TABLET | Freq: Every day | ORAL | 6 refills | Status: DC
Start: 2023-10-08 — End: 2024-02-01

## 2023-10-08 NOTE — Progress Notes (Signed)
 Brief Narrative   Patient ID: Mario Simmons, male    DOB: 1965-08-22, 58 y.o.   MRN: [id number]  Mario Simmons is a 58 y/o AA male diagnosed with HIV in 2019 with risk factor of heterosexual contact. Initial CD4, viral load and genotype unavailable. [id number] negative. History of Mycobacterium Avium Complex infection s/p treatment. Sole medication regimen of Biktarvy since initial diagnosis.    Subjective:    Chief Complaint  Patient presents with   Follow-up    HPI:  Mario Simmons is a 58 y.o. male with HIV disease last seen on 09/07/2023 with well-controlled virus and good adherence and tolerance to [employer].  Viral load was undetectable with CD4 count 500.  Kidney function, liver function, electrolytes within normal ranges.  RPR was nonreactive for syphilis.  Here today for follow-up.  Mario Simmons has been doing well since his last office visit and continues to take [facility] as prescribed with no adverse side effects or problems obtaining medication from the pharmacy.  Covered by Medicaid.  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  Housing is stable with good access to food and has a personal vehicle for transportation.  Completed Cologuard in the last 1 to 2 years and will find results.  Due for routine dental care and will refer to [facility].  Healthcare maintenance reviewed and condoms and site-specific STD testing offered.  Denies fevers, chills, night sweats, headaches, changes in vision, neck pain/stiffness, nausea, diarrhea, vomiting, lesions or rashes.  Lab Results  Component Value Date   CD4TCELL 25 (L) 09/07/2023   CD4TABS 500 09/07/2023   Lab Results  Component Value Date   HIV1RNAQUANT Not Detected 09/07/2023     Allergies  Allergen Reactions   Prednisone Other (See Comments)    "makes me have nightmares"      Outpatient Medications Prior to Visit  Medication Sig Dispense Refill    dextromethorphan-guaiFENesin (MUCINEX DM) 30-600 MG 12hr tablet Take 1 tablet by mouth 2 (two) times daily.     fluticasone (FLONASE) 50 MCG/ACT nasal spray Place 1 spray into both nostrils daily. 16 g 1   BIKTARVY 50-200-25 MG TABS tablet TAKE 1 TABLET BY MOUTH AT BEDTIME 30 tablet 0   No facility-administered medications prior to visit.     Past Medical History:  Diagnosis Date   Allergy    Carpal tunnel syndrome    COVID 04/19/2020   hospital 9/30-10/4/21   Depression    Dry skin    ED (erectile dysfunction)    HIV (human immunodeficiency virus infection) (HCC)    mananged by Dr Danna Hefty at [facility] per pt on 06/06/20   Hypogonadism in male    Sleep apnea    uses cpap nightly     Past Surgical History:  Procedure Laterality Date   CHOLECYSTECTOMY N/A 06/07/2020   Procedure: LAPAROSCOPIC CHOLECYSTECTOMY WITH INTRAOPERATIVE CHOLANGIOGRAM AND UMBILICAL HERNIA REPAIR;  Surgeon: Fritzi Mandes, MD;  Location: [facility] OR;  Service: General;  Laterality: N/A;   FRACTURE SURGERY Left 2010   middle and 4th finger   HERNIA REPAIR     WISDOM TOOTH EXTRACTION        Review of Systems  Constitutional:  Negative for appetite change, chills, fatigue, fever and unexpected weight change.  Eyes:  Negative for visual disturbance.  Respiratory:  Negative for cough, chest tightness, shortness of breath and wheezing.   Cardiovascular:  Negative for chest pain and  leg swelling.  Gastrointestinal:  Negative for abdominal pain, constipation, diarrhea, nausea and vomiting.  Genitourinary:  Negative for dysuria, flank pain, frequency, genital sores, hematuria and urgency.  Skin:  Negative for rash.  Allergic/Immunologic: Negative for immunocompromised state.  Neurological:  Negative for dizziness and headaches.      Objective:    BP 131/87   Pulse 92   Ht 5\' 9"  (1.753 m)   Wt 240 lb (108.9 kg)   SpO2 97%   BMI 35.44 kg/m  Nursing note and vital signs reviewed.  Physical  Exam Constitutional:      General: He is not in acute distress.    Appearance: He is well-developed.  Eyes:     Conjunctiva/sclera: Conjunctivae normal.  Cardiovascular:     Rate and Rhythm: Normal rate and regular rhythm.     Heart sounds: Normal heart sounds. No murmur heard.    No friction rub. No gallop.  Pulmonary:     Effort: Pulmonary effort is normal. No respiratory distress.     Breath sounds: Normal breath sounds. No wheezing or rales.  Chest:     Chest wall: No tenderness.  Abdominal:     General: Bowel sounds are normal.     Palpations: Abdomen is soft.     Tenderness: There is no abdominal tenderness.  Musculoskeletal:     Cervical back: Neck supple.  Lymphadenopathy:     Cervical: No cervical adenopathy.  Skin:    General: Skin is warm and dry.     Findings: No rash.  Neurological:     Mental Status: He is alert and oriented to person, place, and time.  Psychiatric:        Behavior: Behavior normal.        Thought Content: Thought content normal.        Judgment: Judgment normal.         10/08/2023    4:02 PM 09/07/2023    4:33 PM 10/09/2022    3:48 PM 04/03/2022    4:21 PM 12/04/2021    3:56 PM  Depression screen PHQ 2/9  Decreased Interest 0 0 0 0 0  Down, Depressed, Hopeless 0 0 0 0 0  PHQ - 2 Score 0 0 0 0 0       Assessment & Plan:    Patient Active Problem List   Diagnosis Date Noted   Loss of job 09/07/2023   Obesity (BMI 30-39.9) 12/05/2021   Elevated blood sugar 04/22/2021   Healthcare maintenance 05/17/2020   Acute cholecystitis due to biliary calculus 05/11/2020   HIV (human immunodeficiency virus infection) (HCC) 04/20/2020   Pneumonia due to COVID-19 virus 04/19/2020   Hypogonadism in male 07/31/2015   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  Reviewed previous lab work and discussed plan of care and U equals U.  Covered by Medicaid with no problems obtaining medication from the pharmacy.  Social determinants of health reviewed with no interventions indicated.  Continue current dose of Biktarvy.  Plan for follow-up in 4 months  or sooner if needed.       Relevant Medications   bictegravir-emtricitabine-tenofovir AF (BIKTARVY) 50-200-25 MG TABS tablet   Zoster Vaccine Adjuvanted [facility]) injection   Healthcare maintenance   Discussed importance of safe sexual practice and condom use. Condoms and site specific STD testing offered.  Vaccinations reviewed and Prevnar 20 updated and Shingrix sent to pharmacy. Dental referral placed to [facility] for routine dental care.  Colon cancer screening up to date and will need to confirm.       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 Feeling better to be back on his feet.      Other Visit Diagnoses       Need for pneumococcal 20-valent conjugate vaccination    -  Primary   Relevant Orders   Pneumococcal conjugate vaccine 20-valent (Completed)     Need for shingles vaccine            I have changed [employer]. I am also having him start on Zoster Vaccine Adjuvanted. Additionally, I am having him maintain his fluticasone and dextromethorphan-guaiFENesin.   Meds ordered this encounter  Medications   bictegravir-emtricitabine-tenofovir AF (BIKTARVY) 50-200-25 MG TABS tablet    Sig: Take 1 tablet by mouth at bedtime.    Dispense:  30 tablet    Refill:  6    Supervising Provider:   Judyann Munson [phone]    Prescription Type::   Renewal   Zoster Vaccine Adjuvanted [facility])  injection    Sig: Inject 0.5 mLs into the muscle once for 1 dose. Repeat in 2-6 months    Dispense:  0.5 mL    Refill:  1    Supervising Provider:   Judyann Munson [[id number]]     Follow-up: Return in about 4 months (around 02/07/2024), or if symptoms worsen or fail to improve. or sooner if needed.    Marcos Eke, MSN, FNP-C Nurse Practitioner [facility] for Infectious Disease [facility] Main number: [phone]

## 2023-10-08 NOTE — Assessment & Plan Note (Signed)
 Discussed importance of safe sexual practice and condom use. Condoms and site specific STD testing offered.  Vaccinations reviewed and Prevnar 20 updated and Shingrix sent to pharmacy. Dental referral placed to [facility] for routine dental care.  Colon cancer screening up to date and will need to confirm.

## 2023-10-08 NOTE — Assessment & Plan Note (Signed)
 Mr. Ogata continues to have well-controlled virus with good adherence and tolerance to [employer].  Reviewed previous lab work and discussed plan of care and U equals U.  Covered by Medicaid with no problems obtaining medication from the pharmacy.  Social determinants of health reviewed with no interventions indicated.  Continue current dose of Biktarvy.  Plan for follow-up in 4 months or sooner if needed.

## 2023-10-08 NOTE — Assessment & Plan Note (Signed)
 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 Feeling better to be back on his feet.

## 2023-10-08 NOTE — Patient Instructions (Signed)
 Nice to see you.  Continue to take your medication daily as prescribed.  Refills have been sent to the pharmacy.  Plan for follow up in 4 months or sooner if needed with lab work same day.   Have a great day and stay safe!

## 2023-12-15 ENCOUNTER — Other Ambulatory Visit (HOSPITAL_COMMUNITY): Payer: Self-pay

## 2023-12-18 NOTE — Progress Notes (Signed)
 The 10-year ASCVD risk score (Arnett DK, et al., 2019) is: 7.4%   Values used to calculate the score:     Age: 58 years     Sex: Male     Is Non-Hispanic African American: Yes     Diabetic: No     Tobacco smoker: No     Systolic Blood Pressure: 131 mmHg     Is BP treated: No     HDL Cholesterol: 50 mg/dL     Total Cholesterol: 157 mg/dL  No current statin therapy, next appointment note updated.   Adam Sanjuan, BSN, RN

## 2024-02-01 ENCOUNTER — Ambulatory Visit: Admitting: Family

## 2024-02-01 ENCOUNTER — Encounter: Payer: Self-pay | Admitting: Family

## 2024-02-01 ENCOUNTER — Other Ambulatory Visit: Payer: Self-pay

## 2024-02-01 VITALS — BP 116/77 | HR 69 | Temp 97.3°F | Ht 69.0 in | Wt 244.0 lb

## 2024-02-01 DIAGNOSIS — Z21 Asymptomatic human immunodeficiency virus [HIV] infection status: Secondary | ICD-10-CM

## 2024-02-01 DIAGNOSIS — B20 Human immunodeficiency virus [HIV] disease: Secondary | ICD-10-CM | POA: Diagnosis not present

## 2024-02-01 DIAGNOSIS — Z0000 Encounter for general adult medical examination without abnormal findings: Secondary | ICD-10-CM

## 2024-02-01 DIAGNOSIS — Z1212 Encounter for screening for malignant neoplasm of rectum: Secondary | ICD-10-CM

## 2024-02-01 DIAGNOSIS — R739 Hyperglycemia, unspecified: Secondary | ICD-10-CM

## 2024-02-01 DIAGNOSIS — Z9189 Other specified personal risk factors, not elsewhere classified: Secondary | ICD-10-CM

## 2024-02-01 MED ORDER — BIKTARVY 50-200-25 MG PO TABS: 1.0000 | ORAL_TABLET | Freq: Every day | ORAL | 6 refills | Status: AC

## 2024-02-01 MED ORDER — ROSUVASTATIN CALCIUM 10 MG PO TABS: 10.0000 mg | ORAL_TABLET | Freq: Every day | ORAL | 6 refills | Status: AC

## 2024-02-01 NOTE — Assessment & Plan Note (Signed)
 Previously noted to have elevated blood sugar and has been working on lifestyle management. Check A1c. Informed ART may alter A1c results. Continue with lifestyle changes.

## 2024-02-01 NOTE — Assessment & Plan Note (Signed)
 Not currently sexually active. Vaccinations reviewed and recommend Shingrix at the pharmacy when able.  Do for routine dental care. Cologuard completed for colon cancer screening.  Anal pap smear collected.

## 2024-02-01 NOTE — Progress Notes (Signed)
 Brief Narrative   Patient ID: Mario Simmons, male    DOB: 1966-01-18, 58 y.o.   MRN: [id number]  [name] is a 58 y/o AA male diagnosed with HIV in 2019 with risk factor of heterosexual contact. Initial CD4, viral load and genotype unavailable. [id number] negative. History of Mycobacterium Avium Complex infection s/p treatment. Sole medication regimen of Biktarvy  since initial diagnosis.    Subjective:   Chief Complaint  Patient presents with   Follow-up    B20    HPI:  Mario Simmons is a 58 y.o. male with HIV disease last seen on 10/08/2023 with well-controlled virus and good adherence and tolerance to Biktarvy .  Previous viral load on 09/07/2023 was undetectable with CD4 count 500.  Kidney function, liver function, electrolytes within normal ranges.  10-year ASCVD risk of 7.4%.  Here today for routine follow-up.  Mr. Lembke has been doing well since his last office visit and continues to take Biktarvy  as prescribed with no adverse side effects. Missed a couple of weeks of medication back in March secondary to insurance changes. 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 Housing, transportation and access to food is stable. Has concerns about his blood sugars is currently fasting. 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 . Not currently sexually active. Healthcare maintenance reviewed.   Denies fevers, chills, night sweats, headaches, changes in vision, neck pain/stiffness, nausea, diarrhea, vomiting, lesions or rashes.  Lab Results  Component Value Date   CD4TCELL 25 (L) 09/07/2023   CD4TABS 500 09/07/2023   Lab Results  Component Value Date   HIV1RNAQUANT Not Detected 09/07/2023     Allergies  Allergen Reactions   Prednisone Other (See Comments)    makes me have nightmares      Outpatient Medications Prior to Visit  Medication Sig Dispense Refill   fluticasone  (FLONASE ) 50 MCG/ACT nasal spray Place 1  spray into both nostrils daily. 16 g 1   bictegravir-emtricitabine -tenofovir  AF (BIKTARVY ) 50-200-25 MG TABS tablet Take 1 tablet by mouth at bedtime. 30 tablet 6   dextromethorphan -guaiFENesin  (MUCINEX  DM) 30-600 MG 12hr tablet Take 1 tablet by mouth 2 (two) times daily. (Patient not taking: Reported on 02/01/2024)     No facility-administered medications prior to visit.     Past Medical History:  Diagnosis Date   Allergy    Carpal tunnel syndrome    COVID 04/19/2020   hospital 9/30-10/4/21   Depression    Dry skin    ED (erectile dysfunction)    HIV (human immunodeficiency virus infection) (HCC)    mananged by Dr Dorette at [facility] per pt on 06/06/20   Hypogonadism in male    Sleep apnea    uses cpap nightly     Past Surgical History:  Procedure Laterality Date   CHOLECYSTECTOMY N/A 06/07/2020   Procedure: LAPAROSCOPIC CHOLECYSTECTOMY WITH INTRAOPERATIVE CHOLANGIOGRAM AND UMBILICAL HERNIA REPAIR;  Surgeon: Dasie Leonor CROME, MD;  Location: [facility] OR;  Service: General;  Laterality: N/A;   FRACTURE SURGERY Left 2010   middle and 4th finger   HERNIA REPAIR     WISDOM TOOTH EXTRACTION          Review of Systems  Constitutional:  Negative for appetite change, chills, fatigue, fever and unexpected weight change.  Eyes:  Negative for visual disturbance.  Respiratory:  Negative for cough, chest tightness, shortness of breath and wheezing.   Cardiovascular:  Negative for chest pain and leg swelling.  Gastrointestinal:  Negative for abdominal pain, constipation, diarrhea, nausea and vomiting.  Genitourinary:  Negative for dysuria, flank pain, frequency, genital sores, hematuria and urgency.  Skin:  Negative for rash.  Allergic/Immunologic: Negative for immunocompromised state.  Neurological:  Negative for dizziness and headaches.     Objective:   BP 116/77   Pulse 69   Temp (!) 97.3 F (36.3 C) (Temporal)   Ht 5' 9 (1.753 m)   Wt 244 lb (110.7 kg)   SpO2  97%   BMI 36.03 kg/m  Nursing note and vital signs reviewed.  Physical Exam Constitutional:      General: He is not in acute distress.    Appearance: He is well-developed.  Eyes:     Conjunctiva/sclera: Conjunctivae normal.  Cardiovascular:     Rate and Rhythm: Normal rate and regular rhythm.     Heart sounds: Normal heart sounds. No murmur heard.    No friction rub. No gallop.  Pulmonary:     Effort: Pulmonary effort is normal. No respiratory distress.     Breath sounds: Normal breath sounds. No wheezing or rales.  Chest:     Chest wall: No tenderness.  Abdominal:     General: Bowel sounds are normal.     Palpations: Abdomen is soft.     Tenderness: There is no abdominal tenderness.  Musculoskeletal:     Cervical back: Neck supple.  Lymphadenopathy:     Cervical: No cervical adenopathy.  Skin:    General: Skin is warm and dry.     Findings: No rash.  Neurological:     Mental Status: He is alert and oriented to person, place, and time.  Psychiatric:        Mood and Affect: Mood normal.          02/01/2024    4:07 PM 10/08/2023    4:02 PM 09/07/2023    4:33 PM 10/09/2022    3:48 PM 04/03/2022    4:21 PM  Depression screen PHQ 2/9  Decreased Interest 0 0 0 0 0  Down, Depressed, Hopeless 0 0 0 0 0  PHQ - 2 Score 0 0 0 0 0  Altered sleeping 0      Tired, decreased energy 0      Change in appetite 0      Feeling bad or failure about yourself  0      Trouble concentrating 0      Moving slowly or fidgety/restless 0      Suicidal thoughts 0      PHQ-9 Score 0            02/01/2024    4:07 PM  GAD 7 : Generalized Anxiety Score  Nervous, Anxious, on Edge 0  Control/stop worrying 0  Worry too much - different things 0  Trouble relaxing 0  Restless 0  Easily annoyed or irritable 0  Afraid - awful might happen 0  Total GAD 7 Score 0     The 10-year ASCVD risk score (Arnett DK, et al., 2019) is: 5.9%   Values used to calculate the score:     Age: 37 years      Clincally relevant sex: Male     Is Non-Hispanic African American: Yes     Diabetic: No     Tobacco smoker: No     Systolic Blood Pressure: 116 mmHg     Is BP treated: No     HDL Cholesterol: 50 mg/dL     Total  Cholesterol: 157 mg/dL      Assessment & Plan:    Patient Active Problem List   Diagnosis Date Noted   At risk for cardiovascular event 02/01/2024   Loss of job 09/07/2023   Obesity (BMI 30-39.9) 12/05/2021   Elevated blood sugar 04/22/2021   Healthcare maintenance 05/17/2020   Acute cholecystitis due to biliary calculus 05/11/2020   HIV (human immunodeficiency virus infection) (HCC) 04/20/2020   Pneumonia due to COVID-19 virus 04/19/2020   Hypogonadism in male 07/31/2015   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 Micheletti continues to have well controlled virus with good adherence and tolerance to Biktarvy .  Reviewed previous lab work and discussed plan of care and U equals U.  Issues with obtaining medication resolved and currently covered by Medicaid.  Social determinants of health reviewed with no interventions indicated.  Check blood work.  Continue current dose of Biktarvy .  Plan for follow-up in 6 months or sooner if needed with lab work on same day.      Relevant Medications   bictegravir-emtricitabine -tenofovir  AF (BIKTARVY ) 50-200-25 MG TABS tablet   Other Relevant Orders   Comprehensive metabolic panel with GFR   T-helper cell (CD4)- ([facility] clinic only)   HIV-1 RNA quant-no reflex-bld   Healthcare maintenance   Not currently sexually active. Vaccinations reviewed and recommend Shingrix at the pharmacy when able.  Do  for routine dental care. Cologuard completed for colon cancer screening.  Anal pap smear collected.       Elevated blood sugar - Primary   Previously noted to have elevated blood sugar and has been working on lifestyle management. Check A1c. Informed ART may alter A1c results. Continue with lifestyle changes.       Relevant Orders   HgB A1c   At risk for cardiovascular event   Mr. Larch has a 10 year ASCVD risk of 5.9% Discussed recommendations of the REPRIEVE study and will start rosuvastatin  10 mg daily to reduce risk of cardiovascular disease and HIV associated inflammation.       Other Visit Diagnoses       Screening for rectal cancer       Relevant Orders   Cytology - PAP        I have discontinued Jevin Malacara's dextromethorphan -guaiFENesin . I am also having him start on rosuvastatin . Additionally, I am having him maintain his fluticasone  and Biktarvy .   Meds ordered this encounter  Medications   bictegravir-emtricitabine -tenofovir  AF (BIKTARVY ) 50-200-25 MG TABS tablet    Sig: Take 1 tablet by mouth at bedtime.    Dispense:  30 tablet    Refill:  6    Supervising Provider:   SNIDER, CYNTHIA [phone]    Prescription Type::   Renewal   rosuvastatin  (CRESTOR ) 10 MG tablet    Sig: Take 1 tablet (10 mg total) by mouth daily.    Dispense:  30 tablet    Refill:  6    Supervising Provider:   LUIZ CHANNEL [[id number]]     Follow-up: Return in about 6 months (around 08/03/2024). or sooner if needed.    Greg Mell Mellott, MSN, FNP-C Nurse Practitioner  [facility] Main number: [phone]

## 2024-02-01 NOTE — Assessment & Plan Note (Signed)
 Mario Simmons continues to have well controlled virus with good adherence and tolerance to Biktarvy .  Reviewed previous lab work and discussed plan of care and U equals U.  Issues with obtaining medication resolved and currently covered by Medicaid.  Social determinants of health reviewed with no interventions indicated.  Check blood work.  Continue current dose of Biktarvy .  Plan for follow-up in 6 months or sooner if needed with lab work on same day.

## 2024-02-01 NOTE — Patient Instructions (Addendum)
 Nice to see you. ? ?We will check your lab work today. ? ?Continue to take your medication daily as prescribed. ? ?Refills have been sent to the pharmacy. ? ?Plan for follow up in 6 months or sooner if needed with lab work on the same day. ? ?Have a great day and stay safe! ? ?

## 2024-02-01 NOTE — Assessment & Plan Note (Signed)
 Mario Simmons has a 10 year ASCVD risk of 5.9% Discussed recommendations of the REPRIEVE study and will start rosuvastatin  10 mg daily to reduce risk of cardiovascular disease and HIV associated inflammation.

## 2024-02-02 LAB — T-HELPER CELL (CD4) - (RCID CLINIC ONLY)
CD4 % Helper T Cell: 31 % — ABNORMAL LOW (ref 33–65)
CD4 T Cell Abs: 565 /uL (ref 400–1790)

## 2024-02-03 LAB — COMPREHENSIVE METABOLIC PANEL WITH GFR
AG Ratio: 2.1 (calc) (ref 1.0–2.5)
ALT: 12 U/L (ref 9–46)
AST: 14 U/L (ref 10–35)
Albumin: 5 g/dL (ref 3.6–5.1)
Alkaline phosphatase (APISO): 77 U/L (ref 35–144)
BUN: 14 mg/dL (ref 7–25)
CO2: 27 mmol/L (ref 20–32)
Calcium: 10 mg/dL (ref 8.6–10.3)
Chloride: 104 mmol/L (ref 98–110)
Creat: 1.02 mg/dL (ref 0.70–1.30)
Globulin: 2.4 g/dL (ref 1.9–3.7)
Glucose, Bld: 97 mg/dL (ref 65–99)
Potassium: 4.2 mmol/L (ref 3.5–5.3)
Sodium: 139 mmol/L (ref 135–146)
Total Bilirubin: 1.4 mg/dL — ABNORMAL HIGH (ref 0.2–1.2)
Total Protein: 7.4 g/dL (ref 6.1–8.1)
eGFR: 85 mL/min/1.73m2 (ref >=60)

## 2024-02-03 LAB — HEMOGLOBIN A1C
Hgb A1c MFr Bld: 6 % — ABNORMAL HIGH (ref <5.7)
Mean Plasma Glucose: 126 mg/dL
eAG (mmol/L): 7 mmol/L

## 2024-02-03 LAB — HIV-1 RNA QUANT-NO REFLEX-BLD
HIV 1 RNA Quant: NOT DETECTED {copies}/mL
HIV-1 RNA Quant, Log: NOT DETECTED {Log_copies}/mL

## 2024-02-08 LAB — CYTOLOGY - PAP: Diagnosis: NEGATIVE

## 2024-02-09 ENCOUNTER — Ambulatory Visit: Payer: Self-pay | Admitting: Family

## 2024-04-21 ENCOUNTER — Other Ambulatory Visit: Payer: Self-pay

## 2024-04-21 ENCOUNTER — Other Ambulatory Visit (HOSPITAL_COMMUNITY): Payer: Self-pay

## 2024-05-04 ENCOUNTER — Other Ambulatory Visit (HOSPITAL_COMMUNITY): Payer: Self-pay

## 2024-08-11 ENCOUNTER — Other Ambulatory Visit (HOSPITAL_COMMUNITY): Payer: Self-pay

## 2024-08-11 ENCOUNTER — Ambulatory Visit: Admitting: Family
